# Patient Record
Sex: Female | Born: 1970 | Race: Black or African American | Hispanic: No | Smoking: Current every day smoker
Health system: Southern US, Community
[De-identification: ages and names within clinical notes are randomized; demographics above are authoritative.]

## PROBLEM LIST (undated history)

## (undated) DIAGNOSIS — R569 Unspecified convulsions: Secondary | ICD-10-CM

---

## 2014-11-30 ENCOUNTER — Emergency Department (HOSPITAL_COMMUNITY)
Admission: EM | Admit: 2014-11-30 | Discharge: 2014-11-30 | Disposition: A | Discharge status: Home / Self Care | Payer: Medicaid - Out of State | Attending: Emergency Medicine | Admitting: Emergency Medicine

## 2014-11-30 ENCOUNTER — Encounter (HOSPITAL_COMMUNITY): Payer: Self-pay

## 2014-11-30 ENCOUNTER — Emergency Department (HOSPITAL_COMMUNITY): Payer: Medicaid - Out of State

## 2014-11-30 DIAGNOSIS — B349 Viral infection, unspecified: Secondary | ICD-10-CM | POA: Insufficient documentation

## 2014-11-30 DIAGNOSIS — M791 Myalgia: Secondary | ICD-10-CM | POA: Insufficient documentation

## 2014-11-30 DIAGNOSIS — R05 Cough: Secondary | ICD-10-CM

## 2014-11-30 DIAGNOSIS — N390 Urinary tract infection, site not specified: Secondary | ICD-10-CM | POA: Insufficient documentation

## 2014-11-30 DIAGNOSIS — Z720 Tobacco use: Secondary | ICD-10-CM | POA: Insufficient documentation

## 2014-11-30 DIAGNOSIS — R059 Cough, unspecified: Secondary | ICD-10-CM

## 2014-11-30 DIAGNOSIS — Z3202 Encounter for pregnancy test, result negative: Secondary | ICD-10-CM | POA: Insufficient documentation

## 2014-11-30 HISTORY — DX: Unspecified convulsions: R56.9

## 2014-11-30 LAB — CBC WITH DIFFERENTIAL/PLATELET
BASOS ABS: 0 10*3/uL (ref 0.0–0.1)
Basophils Relative: 0 % (ref 0–1)
Eosinophils Absolute: 0 10*3/uL (ref 0.0–0.7)
Eosinophils Relative: 0 % (ref 0–5)
HEMATOCRIT: 44.4 % (ref 36.0–46.0)
HEMOGLOBIN: 14.6 g/dL (ref 12.0–15.0)
Lymphocytes Relative: 5 % — ABNORMAL LOW (ref 12–46)
Lymphs Abs: 0.5 10*3/uL — ABNORMAL LOW (ref 0.7–4.0)
MCH: 32.7 pg (ref 26.0–34.0)
MCHC: 32.9 g/dL (ref 30.0–36.0)
MCV: 99.3 fL (ref 78.0–100.0)
Monocytes Absolute: 0.5 10*3/uL (ref 0.1–1.0)
Monocytes Relative: 5 % (ref 3–12)
NEUTROS ABS: 8.2 10*3/uL — AB (ref 1.7–7.7)
NEUTROS PCT: 90 % — AB (ref 43–77)
PLATELETS: 350 10*3/uL (ref 150–400)
RBC: 4.47 MIL/uL (ref 3.87–5.11)
RDW: 12.9 % (ref 11.5–15.5)
WBC: 9.2 10*3/uL (ref 4.0–10.5)

## 2014-11-30 LAB — COMPREHENSIVE METABOLIC PANEL
ALBUMIN: 4.8 g/dL (ref 3.5–5.2)
ALK PHOS: 55 U/L (ref 39–117)
ALT: 14 U/L (ref 0–35)
ANION GAP: 10 (ref 5–15)
AST: 19 U/L (ref 0–37)
BUN: 9 mg/dL (ref 6–23)
CHLORIDE: 105 mmol/L (ref 96–112)
CO2: 26 mmol/L (ref 19–32)
Calcium: 9.6 mg/dL (ref 8.4–10.5)
Creatinine, Ser: 0.93 mg/dL (ref 0.50–1.10)
GFR calc Af Amer: 87 mL/min — ABNORMAL LOW (ref >90)
GFR calc non Af Amer: 75 mL/min — ABNORMAL LOW (ref >90)
GLUCOSE: 106 mg/dL — AB (ref 70–99)
Potassium: 3.9 mmol/L (ref 3.5–5.1)
Sodium: 141 mmol/L (ref 135–145)
Total Bilirubin: 0.6 mg/dL (ref 0.3–1.2)
Total Protein: 8.9 g/dL — ABNORMAL HIGH (ref 6.0–8.3)

## 2014-11-30 LAB — URINALYSIS, ROUTINE W REFLEX MICROSCOPIC
Bilirubin Urine: NEGATIVE
GLUCOSE, UA: NEGATIVE mg/dL
KETONES UR: NEGATIVE mg/dL
NITRITE: NEGATIVE
PROTEIN: 30 mg/dL — AB
Specific Gravity, Urine: 1.029 (ref 1.005–1.030)
Urobilinogen, UA: 0.2 mg/dL (ref 0.0–1.0)
pH: 5 (ref 5.0–8.0)

## 2014-11-30 LAB — URINE MICROSCOPIC-ADD ON

## 2014-11-30 LAB — POC URINE PREG, ED: Preg Test, Ur: NEGATIVE

## 2014-11-30 MED ORDER — DEXTROSE 5 % IV SOLN
2.0000 g | Freq: Once | INTRAVENOUS | Status: AC
  Administered 2014-11-30: 2 g via INTRAVENOUS
  Filled 2014-11-30: qty 2

## 2014-11-30 MED ORDER — SULFAMETHOXAZOLE-TRIMETHOPRIM 800-160 MG PO TABS: 1.0000 | ORAL_TABLET | Freq: Two times a day (BID) | ORAL | Status: DC

## 2014-11-30 MED ORDER — HYDROCODONE-ACETAMINOPHEN 5-325 MG PO TABS: 2.0000 | ORAL_TABLET | ORAL | Status: DC | PRN

## 2014-11-30 MED ORDER — SODIUM CHLORIDE 0.9 % IV BOLUS (SEPSIS)
1000.0000 mL | Freq: Once | INTRAVENOUS | Status: AC
  Administered 2014-11-30: 1000 mL via INTRAVENOUS

## 2014-11-30 MED ORDER — BENZONATATE 100 MG PO CAPS
200.0000 mg | ORAL_CAPSULE | Freq: Once | ORAL | Status: AC
  Administered 2014-11-30: 200 mg via ORAL
  Filled 2014-11-30: qty 2

## 2014-11-30 MED ORDER — BENZONATATE 100 MG PO CAPS: 100.0000 mg | ORAL_CAPSULE | Freq: Three times a day (TID) | ORAL | Status: DC

## 2014-11-30 MED ORDER — ONDANSETRON 4 MG PO TBDP: 4.0000 mg | ORAL_TABLET | Freq: Three times a day (TID) | ORAL | Status: DC | PRN

## 2014-11-30 MED ORDER — ACETAMINOPHEN 325 MG PO TABS
650.0000 mg | ORAL_TABLET | Freq: Once | ORAL | Status: AC
  Administered 2014-11-30: 650 mg via ORAL
  Filled 2014-11-30: qty 2

## 2014-11-30 MED ORDER — ALBUTEROL SULFATE (2.5 MG/3ML) 0.083% IN NEBU
2.5000 mg | INHALATION_SOLUTION | RESPIRATORY_TRACT | Status: DC | PRN
  Administered 2014-11-30: 2.5 mg via RESPIRATORY_TRACT
  Filled 2014-11-30: qty 3

## 2014-11-30 MED ORDER — KETOROLAC TROMETHAMINE 30 MG/ML IJ SOLN
30.0000 mg | Freq: Once | INTRAMUSCULAR | Status: AC
  Administered 2014-11-30: 30 mg via INTRAVENOUS
  Filled 2014-11-30: qty 1

## 2014-11-30 NOTE — ED Notes (Signed)
Pt stated that she is new to the area, originally from [place]. Pt just started a job and is unable to afford medications at this time. Pt does not receive funds until next week. Will f/u CM

## 2014-11-30 NOTE — ED Notes (Signed)
Patient states she began having watery stools 2 days ago, but loose stools now. Yesterday she began having a productive cough with thick clear sputum, body aches, chills, nasal congestion, and emesis x 2.

## 2014-11-30 NOTE — ED Provider Notes (Signed)
CSN: [id number]     Arrival date & time 11/30/14  1319 History   First MD Initiated Contact with Patient 11/30/14 1422     Chief Complaint  Patient presents with  . Cough  . Emesis      HPI  Patient presents for evaluation. Has bodyaches, cough, nausea, diarrhea. Urinary frequency but not frank dysuria or hematuria. No migraine or stiffness. No blood pus or mucus in her stools. Heme-negative nonbilious emesis. Symptoms for 48 hours.  Was not immunized for influenza.  Past Medical History  Diagnosis Date  . Seizures    History reviewed. No pertinent past surgical history. Family History  Problem Relation Age of Onset  . Diabetes Mother   . Hypertension Mother   . Aortic aneurysm Father   . Stroke Father   . Hypertension Father    History  Substance Use Topics  . Smoking status: Current Every Day Smoker -- 0.50 packs/day    Types: Cigarettes  . Smokeless tobacco: Never Used  . Alcohol Use: No   OB History    No data available     Review of Systems  Constitutional: Positive for fever and fatigue. Negative for chills, diaphoresis and appetite change.  HENT: Negative for mouth sores, sore throat and trouble swallowing.   Eyes: Negative for visual disturbance.  Respiratory: Positive for cough. Negative for chest tightness, shortness of breath and wheezing.   Cardiovascular: Negative for chest pain.  Gastrointestinal: Negative for nausea, vomiting, abdominal pain, diarrhea and abdominal distention.  Endocrine: Negative for polydipsia, polyphagia and polyuria.  Genitourinary: Positive for urgency and frequency. Negative for dysuria and hematuria.  Musculoskeletal: Positive for myalgias. Negative for gait problem.  Skin: Negative for color change, pallor and rash.  Neurological: Negative for dizziness, syncope, light-headedness and headaches.  Hematological: Does not bruise/bleed easily.  Psychiatric/Behavioral: Negative for behavioral problems and confusion.       Allergies  Review of patient's allergies indicates no known allergies.  Home Medications   Prior to Admission medications   Medication Sig Start Date End Date Taking? Authorizing Provider  benzonatate (TESSALON) 100 MG capsule Take 1 capsule (100 mg total) by mouth every 8 (eight) hours. 11/30/14   Rolland PorterMark Nene Aranas, MD  HYDROcodone-acetaminophen (NORCO/VICODIN) 5-325 MG per tablet Take 2 tablets by mouth every 4 (four) hours as needed. 11/30/14   Rolland PorterMark Alona Danford, MD  ondansetron (ZOFRAN ODT) 4 MG disintegrating tablet Take 1 tablet (4 mg total) by mouth every 8 (eight) hours as needed for nausea. 11/30/14   Rolland PorterMark Leveta Wahab, MD  sulfamethoxazole-trimethoprim (SEPTRA DS) 800-160 MG per tablet Take 1 tablet by mouth every 12 (twelve) hours. 11/30/14   Rolland PorterMark Piotr Christopher, MD   BP 141/97 mmHg  Pulse 106  Temp(Src) 98.1 F (36.7 C) (Oral)  Resp 22  SpO2 100%  LMP 11/22/2014 Physical Exam  Constitutional: She is oriented to person, place, and time. She appears well-developed and well-nourished. No distress.  HENT:  Head: Normocephalic.  Eyes: Conjunctivae are normal. Pupils are equal, round, and reactive to light. No scleral icterus.  Neck: Normal range of motion. Neck supple. No thyromegaly present.  Cardiovascular: Normal rate and regular rhythm.  Exam reveals no gallop and no friction rub.   No murmur heard. Pulmonary/Chest: Effort normal and breath sounds normal. No respiratory distress. She has no wheezes. She has no rales.  Mild wheezing. No focal diminished breath sounds.  Abdominal: Soft. Bowel sounds are normal. She exhibits no distension. There is no tenderness. There is no rebound.  Musculoskeletal:  Normal range of motion.  Neurological: She is alert and oriented to person, place, and time.  Skin: Skin is warm and dry. No rash noted.  Psychiatric: She has a normal mood and affect. Her behavior is normal.    ED Course  Procedures (including critical care time) Labs Review Labs Reviewed  CBC  WITH DIFFERENTIAL/PLATELET - Abnormal; Notable for the following:    Neutrophils Relative % 90 (*)    Neutro Abs 8.2 (*)    Lymphocytes Relative 5 (*)    Lymphs Abs 0.5 (*)    All other components within normal limits  COMPREHENSIVE METABOLIC PANEL - Abnormal; Notable for the following:    Glucose, Bld 106 (*)    Total Protein 8.9 (*)    GFR calc non Af Amer 75 (*)    GFR calc Af Amer 87 (*)    All other components within normal limits  URINALYSIS, ROUTINE W REFLEX MICROSCOPIC - Abnormal; Notable for the following:    APPearance TURBID (*)    Hgb urine dipstick LARGE (*)    Protein, ur 30 (*)    Leukocytes, UA SMALL (*)    All other components within normal limits  URINE MICROSCOPIC-ADD ON - Abnormal; Notable for the following:    Squamous Epithelial / LPF MANY (*)    Bacteria, UA MANY (*)    All other components within normal limits  POC URINE PREG, ED    Imaging Review Dg Chest 2 View  11/30/2014   CLINICAL DATA:  Right-sided chest pain for 1 day. Cough and shortness of breath.  EXAM: CHEST  2 VIEW  COMPARISON:  None.  FINDINGS: The heart size and mediastinal contours are within normal limits. Both lungs are clear. The visualized skeletal structures are unremarkable.  IMPRESSION: No active cardiopulmonary disease.   Electronically Signed   By: Gaylyn Rong M.D.   On: 11/30/2014 15:50     EKG Interpretation None      MDM   Final diagnoses:  Cough  UTI (lower urinary tract infection)  Viral syndrome    Patient's urine does show signs of infection. Her symptoms sound like viral syndrome. No leukocytosis. No chest x-ray abnormalities. Urine culture pending. Plan is IV fluids, IV Rocephin, discharged home, rest, fluids, Tylenol, Bactrim, primary care follow-up. ER with acute changes.    Rolland Porter, MD 11/30/14 [phone]

## 2014-11-30 NOTE — Discharge Instructions (Signed)
Urinary Tract Infection A urinary tract infection (UTI) can occur any place along the urinary tract. The tract includes the kidneys, ureters, bladder, and urethra. A type of germ called bacteria often causes a UTI. UTIs are often helped with antibiotic medicine.  HOME CARE   If given, take antibiotics as told by your doctor. Finish them even if you start to feel better.  Drink enough fluids to keep your pee (urine) clear or pale yellow.  Avoid tea, drinks with caffeine, and bubbly (carbonated) drinks.  Pee often. Avoid holding your pee in for a long time.  Pee before and after having sex (intercourse).  Wipe from front to back after you poop (bowel movement) if you are a woman. Use each tissue only once. GET HELP RIGHT AWAY IF:   You have back pain.  You have lower belly (abdominal) pain.  You have chills.  You feel sick to your stomach (nauseous).  You throw up (vomit).  Your burning or discomfort with peeing does not go away.  You have a fever.  Your symptoms are not better in 3 days. MAKE SURE YOU:   Understand these instructions.  Will watch your condition.  Will get help right away if you are not doing well or get worse. Document Released: 02/12/2008 Document Revised: 05/20/2012 Document Reviewed: 03/26/2012 [facility] Patient Information 2015 [place] [place]. This information is not intended to replace advice given to you by your health care provider. Make sure you discuss any questions you have with your health care provider.  Viral Infections A viral infection can be caused by different types of viruses.Most viral infections are not serious and resolve on their own. However, some infections may cause severe symptoms and may lead to further complications. SYMPTOMS Viruses can frequently cause:  Minor sore throat.  Aches and pains.  Headaches.  Runny nose.  Different types of rashes.  Watery eyes.  Tiredness.  Cough.  Loss of  appetite.  Gastrointestinal infections, resulting in nausea, vomiting, and diarrhea. These symptoms do not respond to antibiotics because the infection is not caused by bacteria. However, you might catch a bacterial infection following the viral infection. This is sometimes called a "superinfection." Symptoms of such a bacterial infection may include:  Worsening sore throat with pus and difficulty swallowing.  Swollen neck glands.  Chills and a high or persistent fever.  Severe headache.  Tenderness over the sinuses.  Persistent overall ill feeling (malaise), muscle aches, and tiredness (fatigue).  Persistent cough.  Yellow, green, or brown mucus production with coughing. HOME CARE INSTRUCTIONS   Only take over-the-counter or prescription medicines for pain, discomfort, diarrhea, or fever as directed by your caregiver.  Drink enough water and fluids to keep your urine clear or pale yellow. Sports drinks can provide valuable electrolytes, sugars, and hydration.  Get plenty of rest and maintain proper nutrition. Soups and broths with crackers or rice are fine. SEEK IMMEDIATE MEDICAL CARE IF:   You have severe headaches, shortness of breath, chest pain, neck pain, or an unusual rash.  You have uncontrolled vomiting, diarrhea, or you are unable to keep down fluids.  You or your child has an oral temperature above 102 F (38.9 C), not controlled by medicine.  Your baby is older than 3 months with a rectal temperature of 102 F (38.9 C) or higher.  Your baby is 373 months old or younger with a rectal temperature of 100.4 F (38 C) or higher. MAKE SURE YOU:   Understand these instructions.  Will  watch your condition.  Will get help right away if you are not doing well or get worse. Document Released: 06/05/2005 Document Revised: 11/18/2011 Document Reviewed: 12/31/2010 [facility] Patient Information 2015 [place], [place]. This information is not intended to replace advice given  to you by your health care provider. Make sure you discuss any questions you have with your health care provider.

## 2014-11-30 NOTE — Progress Notes (Addendum)
   CARE MANAGEMENT ED NOTE 11/30/2014   Date Initiated:  11/30/2014  Documentation initiated by:  Edd ArbourGIBBS,[name]  Subjective/Objective Assessment:   44 yr old self pay [place] pt States she recently moved to [place] from [place]/owatery stools 2 days ago, but loose stools now. Yesterday she began having a productive cough with thick clear sputum, body aches, chills, nasal congestion, and emesis x 2..                  Subjective/Objective Assessment Detail:   CM noted pt peeking out of her ed room door with mask intact Pt inquired about bathroom to get her urine specimen Pt directed to bathroom  Pt discussed with CM not getting sick for years until moved to          Action/Plan:   Noted [place] pcp spoke with pt and gave resources for self pay pts   Action/Plan Detail:   Anticipated DC Date:  11/30/2014     Status Recommendation to Physician:   Result of Recommendation:    Other ED Services  Consult Working Plan    DC Planning Services  Other  Outpatient Services - Pt will follow up  PCP issues    Choice offered to / List presented to:            Status of service:  Completed, signed off  ED Comments:   ED Comments Detail:    1700 spoke with pt about cost of d/c rx Discussed family, friends and church members for co pay assistance.  Pt states she was calling her female friend to get help with co pays for medicine.  Discussed with her that [facility] facilities does not have a program to assist with medications.  Pt to be offered good rx coupons, discounts by[facility] ED PM CM.  CM spoke with pt who [place]cp. CM discussed and provided written information for self pay pcps, importance of pcp for f/u care, www.needymeds.org, www.goodrx.com, discounted pharmacies and other [place] resources such as [facility], [facility], affordable care act,  financial assistance, DSS and  health department   Reviewed resources for [place] self pay pcps like [facility], [facility] medicine at [address], [facility], general medical clinics, [facility] urgent care plus others, medication resources, [facility] out [facility] pharmacies and housing Pt voiced understanding and appreciation of resources provided  Provided [facility] contact information Pt agreed to [facility] referral.  Pt denied having an orange card Cm completed referral

## 2016-02-21 ENCOUNTER — Emergency Department (HOSPITAL_COMMUNITY)
Admission: EM | Admit: 2016-02-21 | Discharge: 2016-02-21 | Disposition: A | Discharge status: Home / Self Care | Payer: Medicaid - Out of State | Attending: Emergency Medicine | Admitting: Emergency Medicine

## 2016-02-21 ENCOUNTER — Encounter (HOSPITAL_COMMUNITY): Payer: Self-pay | Admitting: Emergency Medicine

## 2016-02-21 DIAGNOSIS — G40909 Epilepsy, unspecified, not intractable, without status epilepticus: Secondary | ICD-10-CM | POA: Insufficient documentation

## 2016-02-21 DIAGNOSIS — R1110 Vomiting, unspecified: Secondary | ICD-10-CM | POA: Insufficient documentation

## 2016-02-21 DIAGNOSIS — F17210 Nicotine dependence, cigarettes, uncomplicated: Secondary | ICD-10-CM | POA: Insufficient documentation

## 2016-02-21 DIAGNOSIS — R197 Diarrhea, unspecified: Secondary | ICD-10-CM

## 2016-02-21 MED ORDER — LOPERAMIDE HCL 2 MG PO CAPS: 2.0000 mg | ORAL_CAPSULE | Freq: Four times a day (QID) | ORAL | Status: DC | PRN

## 2016-02-21 NOTE — ED Notes (Signed)
Pt is in stable condition upon d/c and ambulates from ED. 

## 2016-02-21 NOTE — ED Provider Notes (Signed)
CSN: [id number]     Arrival date & time 02/21/16  1545 History  By signing my name below, I, [name], attest that this documentation has been prepared under the direction and in the presence of Gaylyn RongSamantha Anthonie Lotito, PA-C Electronically Signed: [name], ED Scribe. 02/21/2016. 6:33 PM.   Chief Complaint  Patient presents with  . wants Dr's note       The history is provided by the patient. No language interpreter was used.    HPI Comments: Nicole BirksOthelia Blevins is a 45 y.o. female who presents to the Emergency Department requesting a doctors note onset today. Pt reports that she missed work 2 days ago due to vomiting and diarrhea. Pt notes that her symptoms began 3 days ago with mild HA, v/d, and diaphoresis that resolved mildly 2 days ago. Pt reports that she was not seen for her symptoms and she returned to work yesterday and was informed that she will need a doctors note to return to work. Pt denies any symptoms at this time. She states that she is having associated symptoms of mild diarrhea and mild abdominal pain. Pt notes that her abdominal pain is alleviated following a bowel movement. She states that she has tried pepto-bismol with no relief for her symptoms. She denies fever, chills, vomiting, blood in stools, and any other symptoms.   Past Medical History  Diagnosis Date  . Seizures (HCC)    History reviewed. No pertinent past surgical history. Family History  Problem Relation Age of Onset  . Diabetes Mother   . Hypertension Mother   . Aortic aneurysm Father   . Stroke Father   . Hypertension Father    Social History  Substance Use Topics  . Smoking status: Current Every Day Smoker -- 0.50 packs/day    Types: Cigarettes  . Smokeless tobacco: Never Used  . Alcohol Use: Yes   OB History    No data available     Review of Systems  Constitutional: Negative for fever and chills.  Gastrointestinal: Positive for abdominal pain and diarrhea (mild). Negative for vomiting and  blood in stool.  All other systems reviewed and are negative.     Allergies  Review of patient's allergies indicates no known allergies.  Home Medications   Prior to Admission medications   Medication Sig Start Date End Date Taking? Authorizing Provider  benzonatate (TESSALON) 100 MG capsule Take 1 capsule (100 mg total) by mouth every 8 (eight) hours. 11/30/14   Rolland PorterMark James, MD  HYDROcodone-acetaminophen (NORCO/VICODIN) 5-325 MG per tablet Take 2 tablets by mouth every 4 (four) hours as needed. 11/30/14   Rolland PorterMark James, MD  ondansetron (ZOFRAN ODT) 4 MG disintegrating tablet Take 1 tablet (4 mg total) by mouth every 8 (eight) hours as needed for nausea. 11/30/14   Rolland PorterMark James, MD  sulfamethoxazole-trimethoprim (SEPTRA DS) 800-160 MG per tablet Take 1 tablet by mouth every 12 (twelve) hours. 11/30/14   Rolland PorterMark James, MD   BP 169/109 mmHg  Pulse 66  Temp(Src) 98.8 F (37.1 C) (Oral)  Resp 18  SpO2 98% Physical Exam  Constitutional: She is oriented to person, place, and time. She appears well-developed and well-nourished. No distress.  HENT:  Head: Normocephalic and atraumatic.  Eyes: Conjunctivae are normal. Right eye exhibits no discharge. Left eye exhibits no discharge. No scleral icterus.  Cardiovascular: Normal rate.   Pulmonary/Chest: Effort normal.  Abdominal: Soft. She exhibits no mass. There is no tenderness. There is no rebound and no guarding.  Neurological: She is  alert and oriented to person, place, and time. Coordination normal.  Skin: Skin is warm and dry. No rash noted. She is not diaphoretic. No erythema. No pallor.  Psychiatric: She has a normal mood and affect. Her behavior is normal.  Nursing note and vitals reviewed.   ED Course  Procedures (including critical care time) DIAGNOSTIC STUDIES: Oxygen Saturation is 98% on RA, nl by my interpretation.    COORDINATION OF CARE: 6:30 PM Discussed treatment plan with pt at bedside and pt agreed to plan.    Labs  Review Labs Reviewed - No data to display  Imaging Review No results found.   EKG Interpretation None      MDM   Final diagnoses:  Diarrhea, unspecified type    Otherwise healthy female presents the ED today requesting a work note stating she can go back to work after being sick for the last couple of days. She states she had a couple days of vomiting and diarrhea that her symptoms have since improved. She appears well in the ED. No fevers or chills. No abdominal tenderness. Feel the patient is safe to return to work. Follow-up with PCP as needed. We'll give prescription for Imodium for intermittent diarrhea.  I personally performed the services described in this documentation, which was scribed in my presence. The recorded information has been reviewed and is accurate.     Lester Kinsman [place], PA-C 02/21/16 2051  Benjiman Core, MD 02/22/16 [phone]

## 2016-02-21 NOTE — Discharge Instructions (Signed)
Diarrhea Diarrhea is frequent loose and watery bowel movements. It can cause you to feel weak and dehydrated. Dehydration can cause you to become tired and thirsty, have a dry mouth, and have decreased urination that often is dark yellow. Diarrhea is a sign of another problem, most often an infection that will not last long. In most cases, diarrhea typically lasts 2-3 days. However, it can last longer if it is a sign of something more serious. It is important to treat your diarrhea as directed by your caregiver to lessen or prevent future episodes of diarrhea. CAUSES  Some common causes include:  Gastrointestinal infections caused by viruses, bacteria, or parasites.  Food poisoning or food allergies.  Certain medicines, such as antibiotics, chemotherapy, and laxatives.  Artificial sweeteners and fructose.  Digestive disorders. HOME CARE INSTRUCTIONS  Ensure adequate fluid intake (hydration): Have 1 cup (8 oz) of fluid for each diarrhea episode. Avoid fluids that contain simple sugars or sports drinks, fruit juices, whole milk products, and sodas. Your urine should be clear or pale yellow if you are drinking enough fluids. Hydrate with an oral rehydration solution that you can purchase at pharmacies, retail stores, and online. You can prepare an oral rehydration solution at home by mixing the following ingredients together:   - tsp table salt.   tsp baking soda.   tsp salt substitute containing potassium chloride.  1  tablespoons sugar.  1 L (34 oz) of water.  Certain foods and beverages may increase the speed at which food moves through the gastrointestinal (GI) tract. These foods and beverages should be avoided and include:  Caffeinated and alcoholic beverages.  High-fiber foods, such as raw fruits and vegetables, nuts, seeds, and whole grain breads and cereals.  Foods and beverages sweetened with sugar alcohols, such as xylitol, sorbitol, and mannitol.  Some foods may be well  tolerated and may help thicken stool including:  Starchy foods, such as rice, toast, pasta, low-sugar cereal, oatmeal, grits, baked potatoes, crackers, and bagels.  Bananas.  Applesauce.  Add probiotic-rich foods to help increase healthy bacteria in the GI tract, such as yogurt and fermented milk products.  Wash your hands well after each diarrhea episode.  Only take over-the-counter or prescription medicines as directed by your caregiver.  Take a warm bath to relieve any burning or pain from frequent diarrhea episodes. SEEK IMMEDIATE MEDICAL CARE IF:   You are unable to keep fluids down.  You have persistent vomiting.  You have blood in your stool, or your stools are black and tarry.  You do not urinate in 6-8 hours, or there is only a small amount of very dark urine.  You have abdominal pain that increases or localizes.  You have weakness, dizziness, confusion, or light-headedness.  You have a severe headache.  Your diarrhea gets worse or does not get better.  You have a fever or persistent symptoms for more than 2-3 days.  You have a fever and your symptoms suddenly get worse. MAKE SURE YOU:   Understand these instructions.  Will watch your condition.  Will get help right away if you are not doing well or get worse.   This information is not intended to replace advice given to you by your health care provider. Make sure you discuss any questions you have with your health care provider.   Follow up with your OBGYN for further questions about contraception. Take immodium as needed for diarrhea. Return tot he ED if you experience severe worsening of your symptoms,  fevers, chills, severe belly pain, vomiting, blood in your stool.

## 2016-02-21 NOTE — ED Notes (Signed)
Pt states she missed work for vomiting and diarrhea on Monday.  Returned to work on Tuesday and was told she had to have a doctor's note.  Pt states she feels fine now.

## 2016-05-27 ENCOUNTER — Emergency Department (HOSPITAL_COMMUNITY)
Admission: EM | Admit: 2016-05-27 | Discharge: 2016-05-27 | Disposition: A | Discharge status: Home / Self Care | Payer: Self-pay | Attending: Emergency Medicine | Admitting: Emergency Medicine

## 2016-05-27 ENCOUNTER — Encounter (HOSPITAL_COMMUNITY): Payer: Self-pay | Admitting: Vascular Surgery

## 2016-05-27 DIAGNOSIS — R112 Nausea with vomiting, unspecified: Secondary | ICD-10-CM | POA: Insufficient documentation

## 2016-05-27 DIAGNOSIS — F17210 Nicotine dependence, cigarettes, uncomplicated: Secondary | ICD-10-CM | POA: Insufficient documentation

## 2016-05-27 DIAGNOSIS — Z5321 Procedure and treatment not carried out due to patient leaving prior to being seen by health care provider: Secondary | ICD-10-CM | POA: Insufficient documentation

## 2016-05-27 LAB — CBC
HEMATOCRIT: 38.2 % (ref 36.0–46.0)
Hemoglobin: 12.7 g/dL (ref 12.0–15.0)
MCH: 31.9 pg (ref 26.0–34.0)
MCHC: 33.2 g/dL (ref 30.0–36.0)
MCV: 96 fL (ref 78.0–100.0)
PLATELETS: 340 10*3/uL (ref 150–400)
RBC: 3.98 MIL/uL (ref 3.87–5.11)
RDW: 13.3 % (ref 11.5–15.5)
WBC: 6.3 10*3/uL (ref 4.0–10.5)

## 2016-05-27 LAB — COMPREHENSIVE METABOLIC PANEL
ALT: 11 U/L — ABNORMAL LOW (ref 14–54)
AST: 14 U/L — AB (ref 15–41)
Albumin: 3.8 g/dL (ref 3.5–5.0)
Alkaline Phosphatase: 44 U/L (ref 38–126)
Anion gap: 6 (ref 5–15)
BILIRUBIN TOTAL: 0.2 mg/dL — AB (ref 0.3–1.2)
BUN: 8 mg/dL (ref 6–20)
CO2: 23 mmol/L (ref 22–32)
CREATININE: 0.93 mg/dL (ref 0.44–1.00)
Calcium: 8.9 mg/dL (ref 8.9–10.3)
Chloride: 108 mmol/L (ref 101–111)
Glucose, Bld: 97 mg/dL (ref 65–99)
POTASSIUM: 3.5 mmol/L (ref 3.5–5.1)
Sodium: 137 mmol/L (ref 135–145)
TOTAL PROTEIN: 6.5 g/dL (ref 6.5–8.1)

## 2016-05-27 LAB — LIPASE, BLOOD: Lipase: 20 U/L (ref 11–51)

## 2016-05-27 NOTE — ED Triage Notes (Signed)
Pt reports to the ED for eval of multiple complaints. She reports she has had a HA since yesterday, some N/V since yesterday, dry mouth intermittently x 2-3 weeks, night sweats, and hot flashes. States she has had all of these symptoms for a while. Was seen here for similar symptoms several months ago and she reports she was not given a specific dx.

## 2016-05-27 NOTE — ED Notes (Signed)
Per pt request, she was given a note stating that she was here yesterday and left before being seen by MD.

## 2016-05-27 NOTE — ED Notes (Signed)
Pt upset and refuse to give urine. Pt states she wants to see the dr then leave.

## 2019-03-12 ENCOUNTER — Other Ambulatory Visit: Payer: Self-pay

## 2019-03-12 ENCOUNTER — Emergency Department (HOSPITAL_COMMUNITY): Payer: BLUE CROSS/BLUE SHIELD

## 2019-03-12 ENCOUNTER — Emergency Department (HOSPITAL_COMMUNITY)
Admission: EM | Admit: 2019-03-12 | Discharge: 2019-03-12 | Disposition: A | Discharge status: Home / Self Care | Payer: BLUE CROSS/BLUE SHIELD | Attending: Emergency Medicine | Admitting: Emergency Medicine

## 2019-03-12 DIAGNOSIS — Y939 Activity, unspecified: Secondary | ICD-10-CM | POA: Insufficient documentation

## 2019-03-12 DIAGNOSIS — Y999 Unspecified external cause status: Secondary | ICD-10-CM | POA: Diagnosis not present

## 2019-03-12 DIAGNOSIS — S90812A Abrasion, left foot, initial encounter: Secondary | ICD-10-CM | POA: Diagnosis not present

## 2019-03-12 DIAGNOSIS — S90811A Abrasion, right foot, initial encounter: Secondary | ICD-10-CM | POA: Diagnosis not present

## 2019-03-12 DIAGNOSIS — Y929 Unspecified place or not applicable: Secondary | ICD-10-CM | POA: Diagnosis not present

## 2019-03-12 DIAGNOSIS — T148XXA Other injury of unspecified body region, initial encounter: Secondary | ICD-10-CM

## 2019-03-12 DIAGNOSIS — F1290 Cannabis use, unspecified, uncomplicated: Secondary | ICD-10-CM | POA: Diagnosis not present

## 2019-03-12 DIAGNOSIS — T07XXXA Unspecified multiple injuries, initial encounter: Secondary | ICD-10-CM | POA: Diagnosis present

## 2019-03-12 DIAGNOSIS — Z23 Encounter for immunization: Secondary | ICD-10-CM | POA: Diagnosis not present

## 2019-03-12 DIAGNOSIS — F17210 Nicotine dependence, cigarettes, uncomplicated: Secondary | ICD-10-CM | POA: Insufficient documentation

## 2019-03-12 MED ORDER — TETANUS-DIPHTH-ACELL PERTUSSIS 5-2.5-18.5 LF-MCG/0.5 IM SUSP
0.5000 mL | Freq: Once | INTRAMUSCULAR | Status: AC
  Administered 2019-03-12: 0.5 mL via INTRAMUSCULAR
  Filled 2019-03-12: qty 0.5

## 2019-03-12 MED ORDER — BACITRACIN-NEOMYCIN-POLYMYXIN OINTMENT TUBE
TOPICAL_OINTMENT | Freq: Once | CUTANEOUS | Status: AC
  Administered 2019-03-12: 03:00:00 via TOPICAL
  Filled 2019-03-12: qty 14
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
## 2019-06-20 ENCOUNTER — Other Ambulatory Visit: Payer: Self-pay

## 2019-06-20 ENCOUNTER — Encounter (HOSPITAL_COMMUNITY): Payer: Self-pay | Admitting: Emergency Medicine

## 2019-06-20 ENCOUNTER — Emergency Department (HOSPITAL_COMMUNITY)
Admission: EM | Admit: 2019-06-20 | Discharge: 2019-06-20 | Disposition: A | Discharge status: Home / Self Care | Payer: BLUE CROSS/BLUE SHIELD | Attending: Emergency Medicine | Admitting: Emergency Medicine

## 2019-06-20 ENCOUNTER — Emergency Department (HOSPITAL_COMMUNITY): Payer: BLUE CROSS/BLUE SHIELD

## 2019-06-20 DIAGNOSIS — F1210 Cannabis abuse, uncomplicated: Secondary | ICD-10-CM | POA: Insufficient documentation

## 2019-06-20 DIAGNOSIS — R1013 Epigastric pain: Secondary | ICD-10-CM | POA: Insufficient documentation

## 2019-06-20 DIAGNOSIS — R61 Generalized hyperhidrosis: Secondary | ICD-10-CM | POA: Diagnosis not present

## 2019-06-20 DIAGNOSIS — F17210 Nicotine dependence, cigarettes, uncomplicated: Secondary | ICD-10-CM | POA: Diagnosis not present

## 2019-06-20 DIAGNOSIS — R197 Diarrhea, unspecified: Secondary | ICD-10-CM | POA: Insufficient documentation

## 2019-06-20 DIAGNOSIS — R112 Nausea with vomiting, unspecified: Secondary | ICD-10-CM | POA: Diagnosis not present

## 2019-06-20 LAB — COMPREHENSIVE METABOLIC PANEL
ALT: 14 U/L (ref 0–44)
AST: 16 U/L (ref 15–41)
Albumin: 3.9 g/dL (ref 3.5–5.0)
Alkaline Phosphatase: 51 U/L (ref 38–126)
Anion gap: 11 (ref 5–15)
BUN: 6 mg/dL (ref 6–20)
CO2: 22 mmol/L (ref 22–32)
Calcium: 9.5 mg/dL (ref 8.9–10.3)
Chloride: 105 mmol/L (ref 98–111)
Creatinine, Ser: 0.94 mg/dL (ref 0.44–1.00)
GFR calc Af Amer: >60 mL/min (ref >60)
GFR calc non Af Amer: >60 mL/min (ref >60)
Glucose, Bld: 137 mg/dL — ABNORMAL HIGH (ref 70–99)
Potassium: 3.8 mmol/L (ref 3.5–5.1)
Sodium: 138 mmol/L (ref 135–145)
Total Bilirubin: 0.8 mg/dL (ref 0.3–1.2)
Total Protein: 7.5 g/dL (ref 6.5–8.1)

## 2019-06-20 LAB — CBC WITH DIFFERENTIAL/PLATELET
Abs Immature Granulocytes: 0.04 10*3/uL (ref 0.00–0.07)
Basophils Absolute: 0 10*3/uL (ref 0.0–0.1)
Basophils Relative: 0 %
Eosinophils Absolute: 0 10*3/uL (ref 0.0–0.5)
Eosinophils Relative: 0 %
HCT: 42.8 % (ref 36.0–46.0)
Hemoglobin: 14.4 g/dL (ref 12.0–15.0)
Immature Granulocytes: 0 %
Lymphocytes Relative: 7 %
Lymphs Abs: 0.8 10*3/uL (ref 0.7–4.0)
MCH: 33.2 pg (ref 26.0–34.0)
MCHC: 33.6 g/dL (ref 30.0–36.0)
MCV: 98.6 fL (ref 80.0–100.0)
Monocytes Absolute: 0.3 10*3/uL (ref 0.1–1.0)
Monocytes Relative: 3 %
Neutro Abs: 10.9 10*3/uL — ABNORMAL HIGH (ref 1.7–7.7)
Neutrophils Relative %: 90 %
Platelets: 361 10*3/uL (ref 150–400)
RBC: 4.34 MIL/uL (ref 3.87–5.11)
RDW: 13.1 % (ref 11.5–15.5)
WBC: 12.2 10*3/uL — ABNORMAL HIGH (ref 4.0–10.5)
nRBC: 0 % (ref 0.0–0.2)

## 2019-06-20 LAB — LIPASE, BLOOD: Lipase: 19 U/L (ref 11–51)

## 2019-06-20 MED ORDER — ALUM & MAG HYDROXIDE-SIMETH 200-200-20 MG/5ML PO SUSP
30.0000 mL | Freq: Once | ORAL | Status: AC
  Administered 2019-06-20: 09:00:00 30 mL via ORAL
  Filled 2019-06-20: qty 30

## 2019-06-20 MED ORDER — MORPHINE SULFATE (PF) 4 MG/ML IV SOLN
4.0000 mg | Freq: Once | INTRAVENOUS | Status: AC
  Administered 2019-06-20: 4 mg via INTRAVENOUS
  Filled 2019-06-20: qty 1

## 2019-06-20 MED ORDER — ONDANSETRON HCL 4 MG/2ML IJ SOLN
4.0000 mg | Freq: Once | INTRAMUSCULAR | Status: AC
  Administered 2019-06-20: 4 mg via INTRAVENOUS
  Filled 2019-06-20: qty 2

## 2019-06-20 MED ORDER — SODIUM CHLORIDE 0.9 % IV BOLUS
1000.0000 mL | Freq: Once | INTRAVENOUS | Status: AC
  Administered 2019-06-20: 1000 mL via INTRAVENOUS

## 2019-06-20 MED ORDER — ONDANSETRON 4 MG PO TBDP: 4.0000 mg | ORAL_TABLET | Freq: Three times a day (TID) | ORAL | 0 refills | Status: DC | PRN

## 2019-06-20 MED ORDER — DICYCLOMINE HCL 10 MG/5ML PO SOLN
10.0000 mg | Freq: Once | ORAL | Status: AC
  Administered 2019-06-20: 10 mg via ORAL
  Filled 2019-06-20: qty 5

## 2019-06-20 NOTE — ED Triage Notes (Signed)
Per [facility] pt c/o upper abdominal pain and nausea onset of 2am. Reports dry heaving, EMS reports no emesis during transport.

## 2019-06-20 NOTE — Discharge Instructions (Signed)
Zofran for nausea, imodium for diarrhea.   Please return for worsening abdominal pain or if it localizes to one area.  Also return if you are unable to keep anything down or have a persistent fever.  Please follow-up with your family doctor in the office.

## 2019-06-20 NOTE — ED Provider Notes (Signed)
[facility] EMERGENCY DEPARTMENT Provider Note   CSN: [id number] Arrival date & time: 06/20/19  0820     History   Chief Complaint Chief Complaint  Patient presents with  . Abdominal Pain    HPI Nicole Blevins is a 48 y.o. female.     48 yo F with a chief complaints of nausea vomiting and diarrhea.  This started about 6 hours ago.  Patient denies any suspicious food intake denies recent travel.  Having some subjective sweating while she was vomiting.  Started with epigastric abdominal pain and then migrated into the entire abdomen with vomiting and diarrhea.  No known sick contacts.  The history is provided by the patient.  Abdominal Pain Pain location:  Epigastric Pain quality: aching and burning   Pain radiates to:  Does not radiate Pain severity:  Moderate Onset quality:  Gradual Duration:  6 hours Timing:  Constant Progression:  Partially resolved Chronicity:  New Relieved by:  Nothing Worsened by:  Nothing Ineffective treatments:  None tried Associated symptoms: diarrhea, nausea and vomiting   Associated symptoms: no chest pain, no chills, no dysuria, no fever and no shortness of breath     Past Medical History:  Diagnosis Date  . Seizures ([place])     There are no active problems to display for this patient.   History reviewed. No pertinent surgical history.   OB History   No obstetric history on file.      Home Medications    Prior to Admission medications   Medication Sig Start Date End Date Taking? Authorizing Provider  benzonatate (TESSALON) 100 MG capsule Take 1 capsule (100 mg total) by mouth every 8 (eight) hours. 11/30/14   Tanna Furry, MD  HYDROcodone-acetaminophen (NORCO/VICODIN) 5-325 MG per tablet Take 2 tablets by mouth every 4 (four) hours as needed. 11/30/14   Tanna Furry, MD  loperamide (IMODIUM) 2 MG capsule Take 1 capsule (2 mg total) by mouth 4 (four) times daily as needed for diarrhea or loose stools. 02/21/16    Dowless, Samantha Tripp, PA-C  ondansetron (ZOFRAN ODT) 4 MG disintegrating tablet Take 1 tablet (4 mg total) by mouth every 8 (eight) hours as needed for nausea or vomiting. 06/20/19   Deno Etienne, DO  sulfamethoxazole-trimethoprim (SEPTRA DS) 800-160 MG per tablet Take 1 tablet by mouth every 12 (twelve) hours. 11/30/14   Tanna Furry, MD    Family History Family History  Problem Relation Age of Onset  . Diabetes Mother   . Hypertension Mother   . Aortic aneurysm Father   . Stroke Father   . Hypertension Father     Social History Social History   Tobacco Use  . Smoking status: Current Every Day Smoker    Packs/day: 0.50    Types: Cigarettes  . Smokeless tobacco: Never Used  Substance Use Topics  . Alcohol use: Yes  . Drug use: Yes    Types: Marijuana    Comment: every other day     Allergies   Patient has no known allergies.   Review of Systems Review of Systems  Constitutional: Negative for chills and fever.  HENT: Negative for congestion and rhinorrhea.   Eyes: Negative for redness and visual disturbance.  Respiratory: Negative for shortness of breath and wheezing.   Cardiovascular: Negative for chest pain and palpitations.  Gastrointestinal: Positive for abdominal pain, diarrhea, nausea and vomiting.  Genitourinary: Negative for dysuria and urgency.  Musculoskeletal: Negative for arthralgias and myalgias.  Skin: Negative for pallor and  wound.  Neurological: Negative for dizziness and headaches.     Physical Exam Updated Vital Signs BP (!) 187/120   Pulse 77   Temp 98.7 F (37.1 C) (Oral)   Resp 19   Ht 5\' 4"  (1.626 m)   Wt 98.9 kg   SpO2 96%   BMI 37.42 kg/m   Physical Exam Vitals signs and nursing note reviewed.  Constitutional:      General: She is not in acute distress.    Appearance: She is well-developed. She is not diaphoretic.  HENT:     Head: Normocephalic and atraumatic.  Eyes:     Pupils: Pupils are equal, round, and reactive to  light.  Neck:     Musculoskeletal: Normal range of motion and neck supple.  Cardiovascular:     Rate and Rhythm: Normal rate and regular rhythm.     Heart sounds: No murmur. No friction rub. No gallop.   Pulmonary:     Effort: Pulmonary effort is normal.     Breath sounds: No wheezing or rales.  Abdominal:     General: There is no distension.     Palpations: Abdomen is soft.     Tenderness: There is abdominal tenderness (mild diffuse, worse to the epigastrium).  Musculoskeletal:        General: No tenderness.  Skin:    General: Skin is warm and dry.  Neurological:     Mental Status: She is alert and oriented to person, place, and time.  Psychiatric:        Behavior: Behavior normal.      ED Treatments / Results  Labs (all labs ordered are listed, but only abnormal results are displayed) Labs Reviewed  CBC WITH DIFFERENTIAL/PLATELET - Abnormal; Notable for the following components:      Result Value   WBC 12.2 (*)    Neutro Abs 10.9 (*)    All other components within normal limits  COMPREHENSIVE METABOLIC PANEL - Abnormal; Notable for the following components:   Glucose, Bld 137 (*)    All other components within normal limits  LIPASE, BLOOD    EKG EKG Interpretation  Date/Time:  Sunday June 20 2019 08:31:45 EDT Ventricular Rate:  71 PR Interval:    QRS Duration: 89 QT Interval:  408 QTC Calculation: 444 R Axis:   6 Text Interpretation:  Sinus rhythm Probable anteroseptal infarct, recent No old tracing to compare Confirmed by [birth year]1995 [phone]) on 06/20/2019 8:34:43 AM   Radiology Dg Chest Port 1 View  Result Date: 06/20/2019 CLINICAL DATA:  Cough. EXAM: PORTABLE CHEST 1 VIEW COMPARISON:  November 30, 2014. FINDINGS: The heart size and mediastinal contours are within normal limits. Both lungs are clear. The visualized skeletal structures are unremarkable. IMPRESSION: No active disease. Electronically Signed   By: December 02, 2014 M.D.   On: 06/20/2019 09:52     Procedures Procedures (including critical care time) Discussed smoking cessation with patient and was they were offerred resources to help stop.  Total time was 5 min CPT code 08/20/2019.   Medications Ordered in ED Medications  dicyclomine (BENTYL) 10 MG/5ML solution 10 mg (has no administration in time range)  alum & mag hydroxide-simeth (MAALOX/MYLANTA) 200-200-20 MG/5ML suspension 30 mL (30 mLs Oral Given 06/20/19 0859)  morphine 4 MG/ML injection 4 mg (4 mg Intravenous Given 06/20/19 0854)  ondansetron (ZOFRAN) injection 4 mg (4 mg Intravenous Given 06/20/19 0853)  sodium chloride 0.9 % bolus 1,000 mL (1,000 mLs Intravenous New Bag/Given 06/20/19 0847)  Initial Impression / Assessment and Plan / ED Course  I have reviewed the triage vital signs and the nursing notes.  Pertinent labs & imaging results that were available during my care of the patient were reviewed by me and considered in my medical decision making (see chart for details).        48 yo F with a chief complaint of nausea vomiting and diarrhea.  This started last night about 6 hours ago.  She is well-appearing and nontoxic.  Mild diffuse abdominal tenderness.  Will obtain lab work.  Bolus of IV fluids treat pain and nausea.  Oral trial.  Based on history and exam most likely this is a viral illness.  Patient is feeling better on reassessment tolerating p.o. without difficulty.  Will discharge home.  PCP follow-up.  10:07 AM:  I have discussed the diagnosis/risks/treatment options with the patient and believe the pt to be eligible for discharge home to follow-up with PCP. We also discussed returning to the ED immediately if new or worsening sx occur. We discussed the sx which are most concerning (e.g., sudden worsening pain, fever, inability to tolerate by mouth) that necessitate immediate return. Medications administered to the patient during their visit and any new prescriptions provided to the patient are listed below.   Medications given during this visit Medications  dicyclomine (BENTYL) 10 MG/5ML solution 10 mg (has no administration in time range)  alum & mag hydroxide-simeth (MAALOX/MYLANTA) 200-200-20 MG/5ML suspension 30 mL (30 mLs Oral Given 06/20/19 0859)  morphine 4 MG/ML injection 4 mg (4 mg Intravenous Given 06/20/19 0854)  ondansetron (ZOFRAN) injection 4 mg (4 mg Intravenous Given 06/20/19 0853)  sodium chloride 0.9 % bolus 1,000 mL (1,000 mLs Intravenous New Bag/Given 06/20/19 0847)     The patient appears reasonably screen and/or stabilized for discharge and I doubt any other medical condition or other [facility] requiring further screening, evaluation, or treatment in the ED at this time prior to discharge.    Final Clinical Impressions(s) / ED Diagnoses   Final diagnoses:  Nausea vomiting and diarrhea    ED Discharge Orders         Ordered    ondansetron (ZOFRAN ODT) 4 MG disintegrating tablet  Every 8 hours PRN     06/20/19 1006           [place], Haniyyah Sakuma, DO 06/20/19 1007

## 2019-06-22 ENCOUNTER — Encounter (HOSPITAL_COMMUNITY): Payer: Self-pay

## 2019-06-22 ENCOUNTER — Ambulatory Visit (HOSPITAL_COMMUNITY)
Admission: EM | Admit: 2019-06-22 | Discharge: 2019-06-22 | Disposition: A | Discharge status: Home / Self Care | Payer: BLUE CROSS/BLUE SHIELD | Attending: Family Medicine | Admitting: Family Medicine

## 2019-06-22 DIAGNOSIS — A084 Viral intestinal infection, unspecified: Secondary | ICD-10-CM

## 2019-06-22 DIAGNOSIS — Z0289 Encounter for other administrative examinations: Secondary | ICD-10-CM | POA: Diagnosis not present

## 2019-06-22 DIAGNOSIS — Z8619 Personal history of other infectious and parasitic diseases: Secondary | ICD-10-CM

## 2019-06-22 NOTE — ED Triage Notes (Signed)
Pt needs a letter to return to work. Pt states she was seen at the ED 2 days ago for nausea, vomiting and diarrhea and now is resolved.

## 2019-06-22 NOTE — ED Provider Notes (Signed)
[facility]    CSN: [id number] Arrival date & time: 06/22/19  0907      History   Chief Complaint Chief Complaint  Patient presents with  . Letter for School/Work    HPI Nicole Blevins is a 48 y.o. female.   HPI  Patient was seen in the emergency room 2 days ago for viral gastroenteritis.  She feels completely well.  Since yesterday she has had no diarrhea.  She is keeping down solid food.  She is drinking water normally.  Today she feels well and tried to go back to work.  She states that she needs a note from a physician before they will allow her to return.  He works in a housekeeping position.  Past Medical History:  Diagnosis Date  . Seizures ([facility])     There are no active problems to display for this patient.   History reviewed. No pertinent surgical history.  OB History   No obstetric history on file.      Home Medications    Prior to Admission medications   Not on File    Family History Family History  Problem Relation Age of Onset  . Diabetes Mother   . Hypertension Mother   . Aortic aneurysm Father   . Stroke Father   . Hypertension Father     Social History Social History   Tobacco Use  . Smoking status: Current Every Day Smoker    Packs/day: 0.50    Types: Cigarettes  . Smokeless tobacco: Never Used  Substance Use Topics  . Alcohol use: Yes  . Drug use: Yes    Types: Marijuana    Comment: every other day     Allergies   Patient has no known allergies.   Review of Systems Review of Systems  Constitutional: Negative for chills and fever.  HENT: Negative for ear pain and sore throat.   Eyes: Negative for pain and visual disturbance.  Respiratory: Negative for cough and shortness of breath.   Cardiovascular: Negative for chest pain and palpitations.  Gastrointestinal: Negative for abdominal pain and vomiting.  Genitourinary: Negative for dysuria and hematuria.  Musculoskeletal: Negative for arthralgias and back  pain.  Skin: Negative for color change and rash.  Neurological: Negative for seizures and syncope.  All other systems reviewed and are negative.    Physical Exam Triage Vital Signs ED Triage Vitals  Enc Vitals Group     BP 06/22/19 1009 (!) 169/97     Pulse Rate 06/22/19 1009 66     Resp 06/22/19 1009 16     Temp 06/22/19 1009 98.3 F (36.8 C)     Temp Source 06/22/19 1009 Oral     SpO2 06/22/19 1009 95 %     Weight --      Height --      Head Circumference --      Peak Flow --      Pain Score 06/22/19 1008 0     Pain Loc --      Pain Edu? --      Excl. in GC? --    No data found.  Updated Vital Signs BP (!) 169/97 (BP Location: Left Arm)   Pulse 66   Temp 98.3 F (36.8 C) (Oral)   Resp 16   SpO2 95%   Visual Acuity Right Eye Distance:   Left Eye Distance:   Bilateral Distance:    Right Eye Near:   Left Eye Near:    Bilateral  Near:     Physical Exam Constitutional:      General: She is not in acute distress.    Appearance: She is well-developed.  HENT:     Head: Normocephalic and atraumatic.  Eyes:     Conjunctiva/sclera: Conjunctivae normal.     Pupils: Pupils are equal, round, and reactive to light.  Neck:     Musculoskeletal: Normal range of motion.  Cardiovascular:     Rate and Rhythm: Normal rate and regular rhythm.  Pulmonary:     Effort: Pulmonary effort is normal. No respiratory distress.     Breath sounds: Normal breath sounds.  Abdominal:     General: Abdomen is flat. There is no distension.     Palpations: Abdomen is soft.     Tenderness: There is no abdominal tenderness.  Musculoskeletal: Normal range of motion.  Skin:    General: Skin is warm and dry.  Neurological:     Mental Status: She is alert.      UC Treatments / Results  Labs (all labs ordered are listed, but only abnormal results are displayed) Labs Reviewed - No data to display  EKG   Radiology No results found.  Procedures Procedures (including critical care  time)  Medications Ordered in UC Medications - No data to display  Initial Impression / Assessment and Plan / UC Course  I have reviewed the triage vital signs and the nursing notes.  Pertinent labs & imaging results that were available during my care of the patient were reviewed by me and considered in my medical decision making (see chart for details).     Patient is asymptomatic.  Normal examination.  Return to work Final Clinical Impressions(s) / UC Diagnoses   Final diagnoses:  History of viral gastroenteritis  Encounter to obtain excuse from work     Discharge Instructions     Return as needed   ED Prescriptions    None     PDMP not reviewed this encounter.   Raylene Everts, MD 06/22/19 1037

## 2019-06-22 NOTE — Discharge Instructions (Signed)
Return as needed

## 2019-06-23 ENCOUNTER — Emergency Department (HOSPITAL_COMMUNITY)
Admission: EM | Admit: 2019-06-23 | Discharge: 2019-06-23 | Discharge status: Against Medical Advice | Payer: BLUE CROSS/BLUE SHIELD | Attending: Emergency Medicine | Admitting: Emergency Medicine

## 2019-06-23 ENCOUNTER — Other Ambulatory Visit: Payer: Self-pay

## 2019-06-23 ENCOUNTER — Encounter (HOSPITAL_COMMUNITY): Payer: Self-pay | Admitting: Emergency Medicine

## 2019-06-23 ENCOUNTER — Ambulatory Visit (INDEPENDENT_AMBULATORY_CARE_PROVIDER_SITE_OTHER)
Admission: EM | Admit: 2019-06-23 | Discharge: 2019-06-23 | Disposition: A | Discharge status: Home / Self Care | Payer: BLUE CROSS/BLUE SHIELD | Source: Home / Self Care | Attending: Family Medicine | Admitting: Family Medicine

## 2019-06-23 DIAGNOSIS — Z3202 Encounter for pregnancy test, result negative: Secondary | ICD-10-CM

## 2019-06-23 DIAGNOSIS — R1032 Left lower quadrant pain: Secondary | ICD-10-CM | POA: Diagnosis not present

## 2019-06-23 DIAGNOSIS — Z5321 Procedure and treatment not carried out due to patient leaving prior to being seen by health care provider: Secondary | ICD-10-CM | POA: Diagnosis not present

## 2019-06-23 DIAGNOSIS — K625 Hemorrhage of anus and rectum: Secondary | ICD-10-CM | POA: Diagnosis present

## 2019-06-23 DIAGNOSIS — K921 Melena: Secondary | ICD-10-CM

## 2019-06-23 LAB — COMPREHENSIVE METABOLIC PANEL
ALT: 11 U/L (ref 0–44)
AST: 14 U/L — ABNORMAL LOW (ref 15–41)
Albumin: 4 g/dL (ref 3.5–5.0)
Alkaline Phosphatase: 46 U/L (ref 38–126)
Anion gap: 12 (ref 5–15)
BUN: 7 mg/dL (ref 6–20)
CO2: 22 mmol/L (ref 22–32)
Calcium: 9.3 mg/dL (ref 8.9–10.3)
Chloride: 105 mmol/L (ref 98–111)
Creatinine, Ser: 1.06 mg/dL — ABNORMAL HIGH (ref 0.44–1.00)
GFR calc Af Amer: >60 mL/min (ref >60)
GFR calc non Af Amer: >60 mL/min (ref >60)
Glucose, Bld: 83 mg/dL (ref 70–99)
Potassium: 3.8 mmol/L (ref 3.5–5.1)
Sodium: 139 mmol/L (ref 135–145)
Total Bilirubin: 0.6 mg/dL (ref 0.3–1.2)
Total Protein: 7.3 g/dL (ref 6.5–8.1)

## 2019-06-23 LAB — POCT URINALYSIS DIP (DEVICE)
Bilirubin Urine: NEGATIVE
Glucose, UA: NEGATIVE mg/dL
Ketones, ur: NEGATIVE mg/dL
Nitrite: NEGATIVE
Protein, ur: NEGATIVE mg/dL
Specific Gravity, Urine: >=1.03 (ref 1.005–1.030)
Urobilinogen, UA: 0.2 mg/dL (ref 0.0–1.0)
pH: 6 (ref 5.0–8.0)

## 2019-06-23 LAB — ABO/RH: ABO/RH(D): AB POS

## 2019-06-23 LAB — TYPE AND SCREEN
ABO/RH(D): AB POS
Antibody Screen: NEGATIVE

## 2019-06-23 LAB — CBC
HCT: 40 % (ref 36.0–46.0)
Hemoglobin: 13.8 g/dL (ref 12.0–15.0)
MCH: 34.2 pg — ABNORMAL HIGH (ref 26.0–34.0)
MCHC: 34.5 g/dL (ref 30.0–36.0)
MCV: 99 fL (ref 80.0–100.0)
Platelets: 343 10*3/uL (ref 150–400)
RBC: 4.04 MIL/uL (ref 3.87–5.11)
RDW: 13 % (ref 11.5–15.5)
WBC: 8.6 10*3/uL (ref 4.0–10.5)
nRBC: 0 % (ref 0.0–0.2)

## 2019-06-23 LAB — POCT PREGNANCY, URINE: Preg Test, Ur: NEGATIVE

## 2019-06-23 LAB — I-STAT BETA HCG BLOOD, ED (MC, WL, AP ONLY): I-stat hCG, quantitative: <5 m[IU]/mL (ref <5)

## 2019-06-23 NOTE — ED Triage Notes (Addendum)
Started with rectal bleed , enough to fill a small cup dark tarry she states  Pain in left side of abd , pt is not on blood thinners

## 2019-06-23 NOTE — Discharge Instructions (Signed)
Go to the er for further evaluation of your symptoms.

## 2019-06-23 NOTE — ED Triage Notes (Signed)
Rectal bleeding started at 5 am.  Noticed tissue bloody and stool was dark.  No more stool.  Patient has pain on left side of abdomen.  Denies burning with urination. Recently has noticed increase in urine output.  Denies any history of dark stool

## 2019-06-23 NOTE — ED Notes (Signed)
Called pt for vitals, no answer x1 

## 2019-06-23 NOTE — ED Notes (Signed)
Called pt for room, no answer x3 

## 2019-06-23 NOTE — ED Notes (Signed)
Called pt for vitals, no answer x2 

## 2019-06-23 NOTE — ED Provider Notes (Signed)
[facility]    CSN: [id number] Arrival date & time: 06/23/19  1417      History   Chief Complaint Chief Complaint  Patient presents with  . Rectal Bleeding    HPI Nicole Blevins is a 48 y.o. female.   Nicole Blevins presents with complaints of dark black tarry stool as well as bright red blood with wiping after stool passage, which occurred today, and now with left sided abdominal pain. 10/11 had episode of vomiting and diarrhea, went to the ER and was treated with antiemetics and morphine in the ER. Was discharged home and felt improved. This morning woke at 0500 with need to pass a BM, noted it was black and tarry, and noted bright red blood to paper. States later today she felt that she needed to have another bm but could only pass gas. Pain to left abdomin. Pain 4/10. Has had increased urination primarily at night. No pain with urination. No nausea or vomiting today. Took peptobismol last 3 days ago but states she vomited it up. Has had stools since then which were soft, no black. No pain with passing a BM. No rectal pain. No previous colonoscopy. She does smoke. No fevers. States she feels her mother has had a history of ulcers but no personal history of any ulcers. Denies any dizziness.     ROS per HPI, negative if not otherwise mentioned.      Past Medical History:  Diagnosis Date  . Seizures ([place])     There are no active problems to display for this patient.   History reviewed. No pertinent surgical history.  OB History   No obstetric history on file.      Home Medications    Prior to Admission medications   Medication Sig Start Date End Date Taking? Authorizing Provider  b complex vitamins capsule Take 1 capsule by mouth daily.   Yes [provider]  NON FORMULARY Vitamin for hair loss   Yes [provider]    Family History Family History  Problem Relation Age of Onset  . Diabetes Mother   . Hypertension Mother   .  Aortic aneurysm Father   . Stroke Father   . Hypertension Father     Social History Social History   Tobacco Use  . Smoking status: Current Every Day Smoker    Packs/day: 0.50    Types: Cigarettes  . Smokeless tobacco: Never Used  Substance Use Topics  . Alcohol use: Not Currently  . Drug use: Yes    Types: Marijuana    Comment: every other day     Allergies   Patient has no known allergies.   Review of Systems Review of Systems   Physical Exam Triage Vital Signs ED Triage Vitals  Enc Vitals Group     BP 06/23/19 1451 (!) 175/102     Pulse Rate 06/23/19 1451 72     Resp 06/23/19 1451 18     Temp 06/23/19 1451 98.5 F (36.9 C)     Temp Source 06/23/19 1451 Oral     SpO2 06/23/19 1451 100 %     Weight --      Height --      Head Circumference --      Peak Flow --      Pain Score 06/23/19 1445 4     Pain Loc --      Pain Edu? --      Excl. in [place]? --  No data found.  Updated Vital Signs BP (!) 175/102 (BP Location: Right Arm)   Pulse 72   Temp 98.5 F (36.9 C) (Oral)   Resp 18   LMP 06/04/2019   SpO2 100%    Physical Exam Constitutional:      General: She is not in acute distress.    Appearance: She is well-developed.  Cardiovascular:     Rate and Rhythm: Normal rate.  Pulmonary:     Effort: Pulmonary effort is normal.  Abdominal:     Tenderness: There is abdominal tenderness. There is no guarding or rebound.       Comments: Left abdomen and LLQ tenderness on palpation  Skin:    General: Skin is warm and dry.  Neurological:     Mental Status: She is alert and oriented to person, place, and time.      UC Treatments / Results  Labs (all labs ordered are listed, but only abnormal results are displayed) Labs Reviewed  POCT URINALYSIS DIP (DEVICE) - Abnormal; Notable for the following components:      Result Value   Hgb urine dipstick MODERATE (*)    Leukocytes,Ua TRACE (*)    All other components within normal limits  POCT  PREGNANCY, URINE    EKG   Radiology No results found.  Procedures Procedures (including critical care time)  Medications Ordered in UC Medications - No data to display  Initial Impression / Assessment and Plan / UC Course  I have reviewed the triage vital signs and the nursing notes.  Pertinent labs & imaging results that were available during my care of the patient were reviewed by me and considered in my medical decision making (see chart for details).     New onset abdominal pain with new black stools. Recent gastroenteritis, vomiting has since resolved. Noted mild leukocytes in er on 10/11 with elevation in neuts as well. Afebrile. I do feel with this change in status further evaluation is warranted. No previous colonoscopies. Recommended return to the ER at this time. Bp stable, ambulatory without difficulty. Self transport to the ER. Patient verbalized understanding and agreeable to plan.   Final Clinical Impressions(s) / UC Diagnoses   Final diagnoses:  Abdominal pain, left lower quadrant  Melena     Discharge Instructions     Go to the er for further evaluation of your symptoms.     ED Prescriptions    None     PDMP not reviewed this encounter.   Georgetta Haber, NP 06/23/19 1538
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
## 2020-04-14 IMAGING — DX DG CHEST 1V PORT
1 series · 1 of 1 positions shown · non-contrast
Comparison: November 30, 2014.

CLINICAL DATA: Cough.

EXAM:
PORTABLE CHEST 1 VIEW

[chest ap]
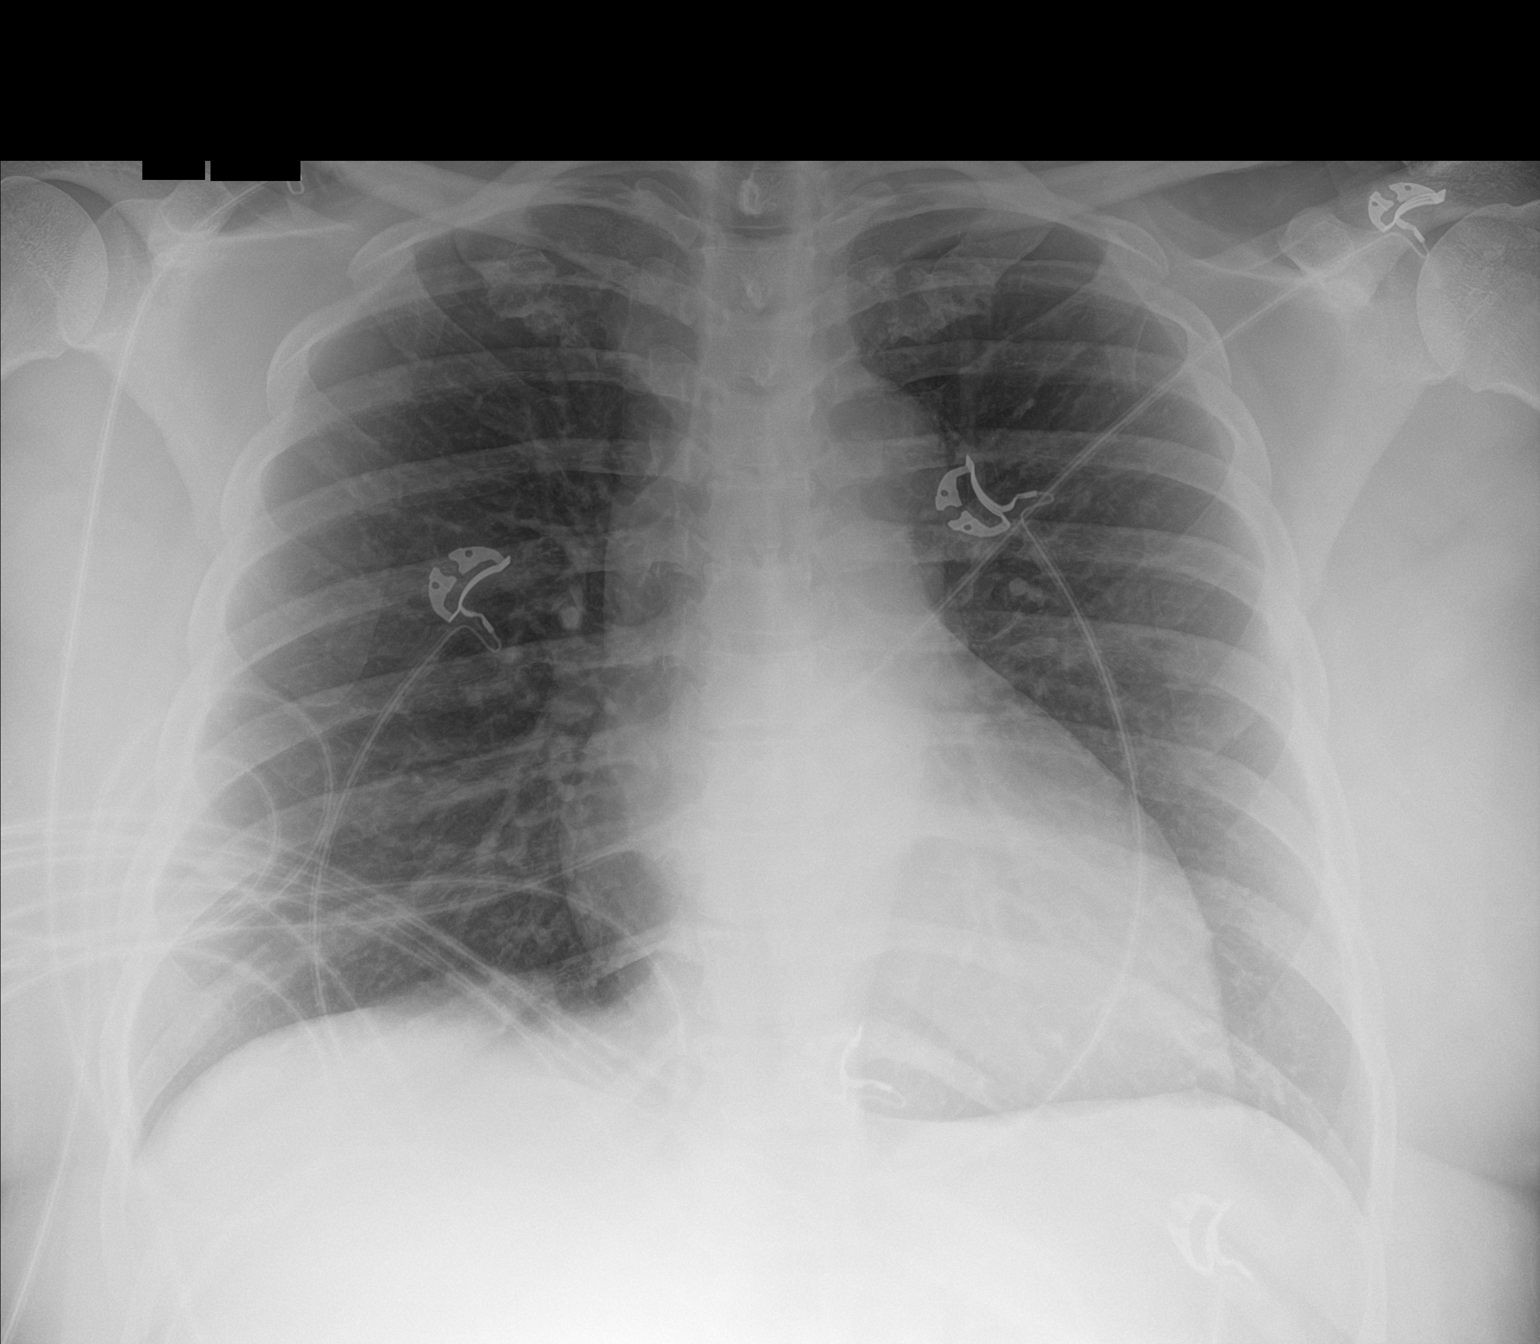

[1 of 1 positions shown; findings below may reference images not displayed]

FINDINGS: The heart size and mediastinal contours are within normal limits.
Both lungs are clear. The visualized skeletal structures are
unremarkable.
IMPRESSION: No active disease.

## 2020-04-15 ENCOUNTER — Emergency Department (HOSPITAL_COMMUNITY)
Admission: EM | Admit: 2020-04-15 | Discharge: 2020-04-15 | Disposition: A | Discharge status: Home / Self Care | Payer: 59 | Attending: Emergency Medicine | Admitting: Emergency Medicine

## 2020-04-15 ENCOUNTER — Encounter (HOSPITAL_COMMUNITY): Payer: Self-pay | Admitting: Emergency Medicine

## 2020-04-15 ENCOUNTER — Other Ambulatory Visit: Payer: Self-pay

## 2020-04-15 DIAGNOSIS — F1290 Cannabis use, unspecified, uncomplicated: Secondary | ICD-10-CM | POA: Diagnosis not present

## 2020-04-15 DIAGNOSIS — R112 Nausea with vomiting, unspecified: Secondary | ICD-10-CM | POA: Diagnosis not present

## 2020-04-15 DIAGNOSIS — R197 Diarrhea, unspecified: Secondary | ICD-10-CM | POA: Insufficient documentation

## 2020-04-15 DIAGNOSIS — F17210 Nicotine dependence, cigarettes, uncomplicated: Secondary | ICD-10-CM | POA: Diagnosis not present

## 2020-04-15 DIAGNOSIS — K529 Noninfective gastroenteritis and colitis, unspecified: Secondary | ICD-10-CM

## 2020-04-15 LAB — COMPREHENSIVE METABOLIC PANEL
ALT: 11 U/L (ref 0–44)
AST: 14 U/L — ABNORMAL LOW (ref 15–41)
Albumin: 3.8 g/dL (ref 3.5–5.0)
Alkaline Phosphatase: 45 U/L (ref 38–126)
Anion gap: 10 (ref 5–15)
BUN: 8 mg/dL (ref 6–20)
CO2: 22 mmol/L (ref 22–32)
Calcium: 8.9 mg/dL (ref 8.9–10.3)
Chloride: 105 mmol/L (ref 98–111)
Creatinine, Ser: 0.94 mg/dL (ref 0.44–1.00)
GFR calc Af Amer: >60 mL/min (ref >60)
GFR calc non Af Amer: >60 mL/min (ref >60)
Glucose, Bld: 140 mg/dL — ABNORMAL HIGH (ref 70–99)
Potassium: 3.5 mmol/L (ref 3.5–5.1)
Sodium: 137 mmol/L (ref 135–145)
Total Bilirubin: 0.5 mg/dL (ref 0.3–1.2)
Total Protein: 6.9 g/dL (ref 6.5–8.1)

## 2020-04-15 LAB — URINALYSIS, ROUTINE W REFLEX MICROSCOPIC
Bilirubin Urine: NEGATIVE
Glucose, UA: NEGATIVE mg/dL
Ketones, ur: NEGATIVE mg/dL
Leukocytes,Ua: NEGATIVE
Nitrite: NEGATIVE
Protein, ur: NEGATIVE mg/dL
RBC / HPF: >50 RBC/hpf — ABNORMAL HIGH (ref 0–5)
Specific Gravity, Urine: 1.017 (ref 1.005–1.030)
pH: 8 (ref 5.0–8.0)

## 2020-04-15 LAB — CBC
HCT: 35.3 % — ABNORMAL LOW (ref 36.0–46.0)
Hemoglobin: 11 g/dL — ABNORMAL LOW (ref 12.0–15.0)
MCH: 29.3 pg (ref 26.0–34.0)
MCHC: 31.2 g/dL (ref 30.0–36.0)
MCV: 94.1 fL (ref 80.0–100.0)
Platelets: 409 10*3/uL — ABNORMAL HIGH (ref 150–400)
RBC: 3.75 MIL/uL — ABNORMAL LOW (ref 3.87–5.11)
RDW: 14.6 % (ref 11.5–15.5)
WBC: 9.8 10*3/uL (ref 4.0–10.5)
nRBC: 0 % (ref 0.0–0.2)

## 2020-04-15 LAB — I-STAT BETA HCG BLOOD, ED (MC, WL, AP ONLY): I-stat hCG, quantitative: <5 m[IU]/mL (ref <5)

## 2020-04-15 LAB — LIPASE, BLOOD: Lipase: 20 U/L (ref 11–51)

## 2020-04-15 MED ORDER — ONDANSETRON 4 MG PO TBDP
4.0000 mg | ORAL_TABLET | Freq: Once | ORAL | Status: AC | PRN
  Administered 2020-04-15: 4 mg via ORAL
  Filled 2020-04-15: qty 1

## 2020-04-15 MED ORDER — SODIUM CHLORIDE 0.9% FLUSH: 3.0000 mL | Freq: Once | INTRAVENOUS | Status: DC

## 2020-04-15 MED ORDER — PENICILLIN V POTASSIUM 500 MG PO TABS
500.0000 mg | ORAL_TABLET | Freq: Four times a day (QID) | ORAL | 0 refills | Status: AC
Start: 2020-04-15 — End: 2020-04-22

## 2020-04-15 MED ORDER — ONDANSETRON 4 MG PO TBDP
4.0000 mg | ORAL_TABLET | Freq: Three times a day (TID) | ORAL | 0 refills | Status: DC | PRN
Start: 2020-04-15 — End: 2021-06-25

## 2020-04-15 NOTE — ED Provider Notes (Signed)
[facility] Emergency Department Provider Note MRN:  [id number]  Arrival date & time: 04/15/20     Chief Complaint   Emesis and Diarrhea   History of Present Illness   Nicole Blevins is a 49 y.o. year-old female with a history of seizure disorder presenting to the ED with chief complaint of emesis and diarrhea.  Patient ate seafood from [employer] restaurant yesterday evening for dinner.  She woke up at 3 or 4 AM with nausea, vomiting, diarrhea.  Has noted small amount of blood streaks in the diarrhea once or twice.  No blood in the vomit.  No significant abdominal pain, no chest pain or shortness of breath, no fever but does feel hot all over when she is vomiting.  Symptoms mild to moderate, constant, no exacerbating or alleviating factors.  Review of Systems  A complete 10 system review of systems was obtained and all systems are negative except as noted in the HPI and PMH.   Patient's Health History    Past Medical History:  Diagnosis Date  . Seizures ([facility])     History reviewed. No pertinent surgical history.  Family History  Problem Relation Age of Onset  . Diabetes Mother   . Hypertension Mother   . Aortic aneurysm Father   . Stroke Father   . Hypertension Father     Social History   Socioeconomic History  . Marital status: Legally Separated    Spouse name: Not on file  . Number of children: Not on file  . Years of education: Not on file  . Highest education level: Not on file  Occupational History  . Not on file  Tobacco Use  . Smoking status: Current Every Day Smoker    Packs/day: 0.50    Types: Cigarettes  . Smokeless tobacco: Never Used  Substance and Sexual Activity  . Alcohol use: Not Currently  . Drug use: Yes    Types: Marijuana    Comment: every other day  . Sexual activity: Not on file  Other Topics Concern  . Not on file  Social History Narrative  . Not on file   Social Determinants of Health   Financial Resource Strain:     . Difficulty of Paying Living Expenses:   Food Insecurity:   . Worried About Programme researcher, broadcasting/film/video in the Last Year:   . Barista in the Last Year:   Transportation Needs:   . Freight forwarder (Medical):   Marland Kitchen Lack of Transportation (Non-Medical):   Physical Activity:   . Days of Exercise per Week:   . Minutes of Exercise per Session:   Stress:   . Feeling of Stress :   Social Connections:   . Frequency of Communication with Friends and Family:   . Frequency of Social Gatherings with Friends and Family:   . Attends Religious Services:   . Active Member of Clubs or Organizations:   . Attends Banker Meetings:   Marland Kitchen Marital Status:   Intimate Partner Violence:   . Fear of Current or Ex-Partner:   . Emotionally Abused:   Marland Kitchen Physically Abused:   . Sexually Abused:      Physical Exam   Vitals:   04/15/20 0709 04/15/20 0941  BP: (!) 181/89 (!) 187/102  Pulse: 66 73  Resp: 18 17  Temp: 98 F (36.7 C)   SpO2: 100% 100%    CONSTITUTIONAL: Well-appearing, NAD NEURO:  Alert and oriented x 3, no focal deficits  EYES:  eyes equal and reactive ENT/NECK:  no LAD, no JVD CARDIO: Regular rate, well-perfused, normal S1 and S2 PULM:  CTAB no wheezing or rhonchi GI/GU:  normal bowel sounds, non-distended, non-tender MSK/SPINE:  No gross deformities, no edema SKIN:  no rash, atraumatic PSYCH:  Appropriate speech and behavior  *Additional and/or pertinent findings included in MDM below  Diagnostic and Interventional Summary    EKG Interpretation  Date/Time:    Ventricular Rate:    PR Interval:    QRS Duration:   QT Interval:    QTC Calculation:   R Axis:     Text Interpretation:        Labs Reviewed  COMPREHENSIVE METABOLIC PANEL - Abnormal; Notable for the following components:      Result Value   Glucose, Bld 140 (*)    AST 14 (*)    All other components within normal limits  CBC - Abnormal; Notable for the following components:   RBC 3.75 (*)     Hemoglobin 11.0 (*)    HCT 35.3 (*)    Platelets 409 (*)    All other components within normal limits  URINALYSIS, ROUTINE W REFLEX MICROSCOPIC - Abnormal; Notable for the following components:   APPearance HAZY (*)    Hgb urine dipstick LARGE (*)    RBC / HPF >50 (*)    Bacteria, UA RARE (*)    All other components within normal limits  LIPASE, BLOOD  I-STAT BETA HCG BLOOD, ED ([facility], [facility], [facility] ONLY)    No orders to display    Medications  sodium chloride flush (NS) 0.9 % injection 3 mL (has no administration in time range)  ondansetron (ZOFRAN-ODT) disintegrating tablet 4 mg (4 mg Oral Given 04/15/20 0725)     Procedures  /  Critical Care Procedures  ED Course and Medical Decision Making  I have reviewed the triage vital signs, the nursing notes, and pertinent available records from the EMR.  Listed above are laboratory and imaging tests that I personally ordered, reviewed, and interpreted and then considered in my medical decision making (see below for details).      Consistent with viral gastroenteritis or food poisoning, reassuring vital signs, soft and nontender abdomen with no rebound, no guarding, no rigidity, no focal tenderness.  No indication for imaging at this time, laboratory assessment is reassuring, patient is overall feeling better, she is tolerating p.o., she is appropriate for discharge.  Patient also has a painful tooth that on exam is with significant decay and needs extraction, there is no contiguous significant infection or abscess, but I do suspect a tooth infection she would benefit from antibiotics and dental follow-up.    Elmer Sow. Pilar Plate, MD [facility] Emergency Medicine [facility] [email or web address]  Final Clinical Impressions(s) / ED Diagnoses     ICD-10-CM   1. Nausea vomiting and diarrhea  R11.2    R19.7   2. Gastroenteritis  K52.9     ED Discharge Orders         Ordered    ondansetron (ZOFRAN ODT) 4 MG disintegrating  tablet  Every 8 hours PRN     Discontinue  Reprint     04/15/20 1120    penicillin v potassium (VEETID) 500 MG tablet  4 times daily     Discontinue  Reprint     04/15/20 1120           Discharge Instructions Discussed with and Provided to Patient:     Discharge  Instructions     You were evaluated in the Emergency Department and after careful evaluation, we did not find any emergent condition requiring admission or further testing in the hospital.  Your exam/testing today was overall reassuring.  Your symptoms seem to be due to food poisoning or a mild infection of the intestines from a virus.  We recommend plenty of fluids at home, you can use the Zofran as needed for nausea.  You can use over-the-counter Imodium as needed for diarrhea.  You should be feeling better within the next few days.  Regarding your painful tooth, we recommend taking the penicillin antibiotic as directed and following up with a dentist for likely extraction of the tooth.  Please return to the Emergency Department if you experience any worsening of your condition.  Thank you for allowing [place] to be a part of your care.       Sabas Sous, MD 04/15/20 1124

## 2020-04-15 NOTE — Discharge Instructions (Addendum)
You were evaluated in the Emergency Department and after careful evaluation, we did not find any emergent condition requiring admission or further testing in the hospital.  Your exam/testing today was overall reassuring.  Your symptoms seem to be due to food poisoning or a mild infection of the intestines from a virus.  We recommend plenty of fluids at home, you can use the Zofran as needed for nausea.  You can use over-the-counter Imodium as needed for diarrhea.  You should be feeling better within the next few days.  Regarding your painful tooth, we recommend taking the penicillin antibiotic as directed and following up with a dentist for likely extraction of the tooth.  Please return to the Emergency Department if you experience any worsening of your condition.  Thank you for allowing [place] to be a part of your care.

## 2020-04-15 NOTE — ED Triage Notes (Signed)
Pt in w/emesis and diarrhea since waking at 0400. Says the last thing she ate was [employer] yesterday evening. Woke up w/intermittent R sided pain. Is currently having cramps from cycle as well. Denies any fevers

## 2020-04-15 NOTE — ED Notes (Signed)
Patient given discharge instructions. Questions were answered. Patient verbalized understanding of discharge instructions and care at home.  

## 2021-06-25 ENCOUNTER — Emergency Department (HOSPITAL_COMMUNITY)
Admission: EM | Admit: 2021-06-25 | Discharge: 2021-06-25 | Disposition: A | Discharge status: Home / Self Care | Payer: 59 | Attending: Emergency Medicine | Admitting: Emergency Medicine

## 2021-06-25 DIAGNOSIS — R110 Nausea: Secondary | ICD-10-CM | POA: Diagnosis not present

## 2021-06-25 DIAGNOSIS — F17210 Nicotine dependence, cigarettes, uncomplicated: Secondary | ICD-10-CM | POA: Insufficient documentation

## 2021-06-25 DIAGNOSIS — K625 Hemorrhage of anus and rectum: Secondary | ICD-10-CM | POA: Diagnosis not present

## 2021-06-25 DIAGNOSIS — K029 Dental caries, unspecified: Secondary | ICD-10-CM

## 2021-06-25 DIAGNOSIS — R1084 Generalized abdominal pain: Secondary | ICD-10-CM

## 2021-06-25 DIAGNOSIS — R1032 Left lower quadrant pain: Secondary | ICD-10-CM

## 2021-06-25 DIAGNOSIS — J029 Acute pharyngitis, unspecified: Secondary | ICD-10-CM

## 2021-06-25 LAB — URINALYSIS, ROUTINE W REFLEX MICROSCOPIC
Bilirubin Urine: NEGATIVE
Glucose, UA: NEGATIVE mg/dL
Ketones, ur: NEGATIVE mg/dL
Leukocytes,Ua: NEGATIVE
Nitrite: NEGATIVE
Protein, ur: NEGATIVE mg/dL
Specific Gravity, Urine: 1.016 (ref 1.005–1.030)
pH: 6 (ref 5.0–8.0)

## 2021-06-25 LAB — CBC
HCT: 32.3 % — ABNORMAL LOW (ref 36.0–46.0)
Hemoglobin: 9.9 g/dL — ABNORMAL LOW (ref 12.0–15.0)
MCH: 27.1 pg (ref 26.0–34.0)
MCHC: 30.7 g/dL (ref 30.0–36.0)
MCV: 88.5 fL (ref 80.0–100.0)
Platelets: 395 10*3/uL (ref 150–400)
RBC: 3.65 MIL/uL — ABNORMAL LOW (ref 3.87–5.11)
RDW: 17.2 % — ABNORMAL HIGH (ref 11.5–15.5)
WBC: 4.6 10*3/uL (ref 4.0–10.5)
nRBC: 0 % (ref 0.0–0.2)

## 2021-06-25 LAB — COMPREHENSIVE METABOLIC PANEL
ALT: 14 U/L (ref 0–44)
AST: 18 U/L (ref 15–41)
Albumin: 3.3 g/dL — ABNORMAL LOW (ref 3.5–5.0)
Alkaline Phosphatase: 36 U/L — ABNORMAL LOW (ref 38–126)
Anion gap: 6 (ref 5–15)
BUN: 9 mg/dL (ref 6–20)
CO2: 27 mmol/L (ref 22–32)
Calcium: 9.1 mg/dL (ref 8.9–10.3)
Chloride: 103 mmol/L (ref 98–111)
Creatinine, Ser: 0.77 mg/dL (ref 0.44–1.00)
GFR, Estimated: >60 mL/min (ref >60)
Glucose, Bld: 109 mg/dL — ABNORMAL HIGH (ref 70–99)
Potassium: 2.9 mmol/L — ABNORMAL LOW (ref 3.5–5.1)
Sodium: 136 mmol/L (ref 135–145)
Total Bilirubin: 0.2 mg/dL — ABNORMAL LOW (ref 0.3–1.2)
Total Protein: 6.4 g/dL — ABNORMAL LOW (ref 6.5–8.1)

## 2021-06-25 LAB — POC OCCULT BLOOD, ED: Fecal Occult Bld: NEGATIVE

## 2021-06-25 LAB — I-STAT BETA HCG BLOOD, ED (MC, WL, AP ONLY): I-stat hCG, quantitative: <5 m[IU]/mL (ref <5)

## 2021-06-25 LAB — LIPASE, BLOOD: Lipase: 29 U/L (ref 11–51)

## 2021-06-25 MED ORDER — PENICILLIN V POTASSIUM 500 MG PO TABS
500.0000 mg | ORAL_TABLET | Freq: Four times a day (QID) | ORAL | 0 refills | Status: AC
Start: 2021-06-25 — End: 2021-07-02

## 2021-06-25 NOTE — ED Triage Notes (Signed)
Pt. Stated, this has been going on for 2 years off and on.

## 2021-06-25 NOTE — Discharge Instructions (Addendum)
Your blood count is somewhat lower than previously.  Make sure you are getting plenty of rest drink a lot of fluids and eat a regular balanced diet.  If your condition worsens with pain, weakness or dizziness, return to the ED.  Otherwise call the gastroenterologist for follow-up appointment about the abdominal pain and rectal bleeding.  We are giving a prescription to treat the soreness in your throat.  See your primary care doctor for ongoing care and treatment as needed.

## 2021-06-25 NOTE — ED Triage Notes (Signed)
Pt. Stated, Nicole Blevins had rectal bleeding off and on with stomach pain N/V. Hurts in the lower back flank area.

## 2021-06-25 NOTE — ED Provider Notes (Signed)
[facility] EMERGENCY DEPARTMENT Provider Note   CSN: [id number] Arrival date & time: 06/25/21  5498     History Chief Complaint  Patient presents with   Abdominal Pain   Nausea   Rectal Bleeding    Nicole Blevins is a 50 y.o. female.  HPI She presents for evaluation of abdominal discomfort and intermittent rectal bleeding for 2 years.  She also complains of a knot in the left groin region.  She denies fever, chills, vomiting, diarrhea, dizziness or weakness.  There are no other known active modifying factors.    Past Medical History:  Diagnosis Date   Seizures ([place])     There are no problems to display for this patient.   No past surgical history on file.   OB History   No obstetric history on file.     Family History  Problem Relation Age of Onset   Diabetes Mother    Hypertension Mother    Aortic aneurysm Father    Stroke Father    Hypertension Father     Social History   Tobacco Use   Smoking status: Every Day    Packs/day: 0.50    Types: Cigarettes   Smokeless tobacco: Never  Substance Use Topics   Alcohol use: Not Currently   Drug use: Yes    Types: Marijuana    Comment: every other day    Home Medications Prior to Admission medications   Medication Sig Start Date End Date Taking? Authorizing Provider  penicillin v potassium (VEETID) 500 MG tablet Take 1 tablet (500 mg total) by mouth 4 (four) times daily for 7 days. 06/25/21 07/02/21 Yes Daleen Bo, MD    Allergies    Patient has no known allergies.  Review of Systems   Review of Systems  All other systems reviewed and are negative.  Physical Exam Updated Vital Signs BP (!) 135/91   Pulse 93   Temp 98.5 F (36.9 C) (Oral)   Resp (!) 21   LMP 06/05/2021   SpO2 99%   Physical Exam Vitals and nursing note reviewed.  Constitutional:      General: She is not in acute distress.    Appearance: She is well-developed. She is not ill-appearing, toxic-appearing  or diaphoretic.  HENT:     Head: Normocephalic and atraumatic.     Right Ear: External ear normal.     Left Ear: External ear normal.  Eyes:     Conjunctiva/sclera: Conjunctivae normal.     Pupils: Pupils are equal, round, and reactive to light.  Neck:     Trachea: Phonation normal.  Cardiovascular:     Rate and Rhythm: Normal rate and regular rhythm.     Heart sounds: Normal heart sounds.  Pulmonary:     Effort: Pulmonary effort is normal.     Breath sounds: Normal breath sounds.  Abdominal:     General: There is no distension.     Palpations: Abdomen is soft.     Tenderness: There is no abdominal tenderness.     Comments: Trivial adenopathy, left groin, 5 cm, few palpable nodes.  Note that the mons pubis has been shaved.  No associated pustules, drainage or other sites of tenderness.  No palpable hernia.  Genitourinary:    Comments: Normal anus.  Small amount of brown stool in rectal vault.  No rectal mass or impaction.  No anal fissure or hemorrhoids either seen or palpated Musculoskeletal:        General: Normal range  of motion.     Cervical back: Normal range of motion and neck supple.  Skin:    General: Skin is warm and dry.  Neurological:     Mental Status: She is alert and oriented to person, place, and time.     Cranial Nerves: No cranial nerve deficit.     Sensory: No sensory deficit.     Motor: No abnormal muscle tone.     Coordination: Coordination normal.  Psychiatric:        Mood and Affect: Mood normal.        Behavior: Behavior normal.        Thought Content: Thought content normal.        Judgment: Judgment normal.    ED Results / Procedures / Treatments   Labs (all labs ordered are listed, but only abnormal results are displayed) Labs Reviewed  COMPREHENSIVE METABOLIC PANEL - Abnormal; Notable for the following components:      Result Value   Potassium 2.9 (*)    Glucose, Bld 109 (*)    Total Protein 6.4 (*)    Albumin 3.3 (*)    Alkaline  Phosphatase 36 (*)    Total Bilirubin 0.2 (*)    All other components within normal limits  CBC - Abnormal; Notable for the following components:   RBC 3.65 (*)    Hemoglobin 9.9 (*)    HCT 32.3 (*)    RDW 17.2 (*)    All other components within normal limits  URINALYSIS, ROUTINE W REFLEX MICROSCOPIC - Abnormal; Notable for the following components:   APPearance HAZY (*)    Hgb urine dipstick MODERATE (*)    Bacteria, UA RARE (*)    All other components within normal limits  LIPASE, BLOOD  I-STAT BETA HCG BLOOD, ED ([facility], [facility], [facility] ONLY)  POC OCCULT BLOOD, ED    EKG None  Radiology No results found.  Procedures Procedures   Medications Ordered in ED Medications - No data to display  ED Course  I have reviewed the triage vital signs and the nursing notes.  Pertinent labs & imaging results that were available during my care of the patient were reviewed by me and considered in my medical decision making (see chart for details).    MDM Rules/Calculators/A&P                            Patient Vitals for the past 24 hrs:  BP Temp Temp src Pulse Resp SpO2  06/25/21 1330 -- 98.5 F (36.9 C) Oral -- -- --  06/25/21 1245 (!) 135/91 -- -- 93 (!) 21 99 %  06/25/21 1230 (!) 151/86 -- -- 77 (!) 21 100 %  06/25/21 1125 (!) 131/99 -- -- 69 17 100 %  06/25/21 0818 (!) 145/105 97.7 F (36.5 C) Oral 74 18 100 %   At the time of discharge -reevaluation with update and discussion. After initial assessment and treatment, an updated evaluation reveals she is comfortable has no further complaints.Daleen Bo   Medical Decision Making:  This patient is presenting for evaluation of abdominal pain with visualized rectal bleeding and groin sore, which does require a range of treatment options, and is a complaint that involves a moderate risk of morbidity and mortality. The differential diagnoses include intestinal infection, colonic bleeding, upper GI bleed. I decided to review old  records, and in summary millage female presenting with chronic symptoms 2 years of abdominal pain  and rectal bleeding.  She also has acute complaint of left groin swelling..  I did not require additional historical information from anyone.  Clinical Laboratory Tests Ordered, included CBC, Metabolic panel, Urinalysis, Pregnancy test, and occult blood stool . Review indicates normal except hemoglobin low, potassium low, glucose high, total protein low, albumin low, alk phos stays close, urinalysis with blood.      Critical Interventions-clinical evaluation, laboratory testing, observation and reassessment  After These Interventions, the Patient was reevaluated and was found she has nonspecific findings with mildly low hemoglobin.  Intermittent abdominal pain and bleeding for 2 years.  Hemodynamically stable.  She has carious teeth and tenderness and sore throat.  She will be covered with antibiotic for possible dental infection.  Nonspecific left groin pain secondary to lymphadenopathy, with nonspecific etiology.  I explained to the patient that this is sometimes inflammatory can be from shaving the pelvic area.  She was not comfortable with this diagnosis I attempted to reassure her that there was nothing wrong with shaving, only that the adenopathy is nonspecific and does not need further assessment, or treatment at this time.  She will be referred to GI for ongoing management and evaluation of abdominal pain with rectal bleeding.  She likely needs a colonoscopy.  CRITICAL CARE-no Performed by: Daleen Bo  Nursing Notes Reviewed/ Care Coordinated Applicable Imaging Reviewed Interpretation of Laboratory Data incorporated into ED treatment  The patient appears reasonably screened and/or stabilized for discharge and I doubt any other medical condition or other [facility] requiring further screening, evaluation, or treatment in the ED at this time prior to discharge.  Plan: Home Medications-continue  usual; Home Treatments-rest, fluids; return here if the recommended treatment, does not improve the symptoms; Recommended follow up-GI follow-up as needed.     Final Clinical Impression(s) / ED Diagnoses Final diagnoses:  Rectal bleeding  Left inguinal pain  Dental caries  Sore throat  Generalized abdominal pain    Rx / DC Orders ED Discharge Orders          Ordered    penicillin v potassium (VEETID) 500 MG tablet  4 times daily        06/25/21 1327             Daleen Bo, MD 06/25/21 2118

## 2022-09-15 ENCOUNTER — Emergency Department (HOSPITAL_COMMUNITY): Payer: Commercial Managed Care - HMO

## 2022-09-15 ENCOUNTER — Emergency Department (HOSPITAL_COMMUNITY)
Admission: EM | Admit: 2022-09-15 | Discharge: 2022-09-15 | Disposition: A | Discharge status: Home / Self Care | Payer: Commercial Managed Care - HMO | Attending: Emergency Medicine | Admitting: Emergency Medicine

## 2022-09-15 ENCOUNTER — Other Ambulatory Visit: Payer: Self-pay

## 2022-09-15 DIAGNOSIS — S0990XA Unspecified injury of head, initial encounter: Secondary | ICD-10-CM | POA: Diagnosis not present

## 2022-09-15 DIAGNOSIS — R6884 Jaw pain: Secondary | ICD-10-CM | POA: Diagnosis not present

## 2022-09-15 DIAGNOSIS — R519 Headache, unspecified: Secondary | ICD-10-CM | POA: Insufficient documentation

## 2022-09-15 DIAGNOSIS — M79672 Pain in left foot: Secondary | ICD-10-CM | POA: Diagnosis not present

## 2022-09-15 DIAGNOSIS — R0689 Other abnormalities of breathing: Secondary | ICD-10-CM | POA: Diagnosis not present

## 2022-09-15 DIAGNOSIS — R7309 Other abnormal glucose: Secondary | ICD-10-CM | POA: Insufficient documentation

## 2022-09-15 DIAGNOSIS — M25561 Pain in right knee: Secondary | ICD-10-CM | POA: Insufficient documentation

## 2022-09-15 DIAGNOSIS — S0993XA Unspecified injury of face, initial encounter: Secondary | ICD-10-CM | POA: Diagnosis not present

## 2022-09-15 DIAGNOSIS — I10 Essential (primary) hypertension: Secondary | ICD-10-CM | POA: Diagnosis not present

## 2022-09-15 LAB — CBC
HCT: 26.9 % — ABNORMAL LOW (ref 36.0–46.0)
Hemoglobin: 8.3 g/dL — ABNORMAL LOW (ref 12.0–15.0)
MCH: 25.8 pg — ABNORMAL LOW (ref 26.0–34.0)
MCHC: 30.9 g/dL (ref 30.0–36.0)
MCV: 83.5 fL (ref 80.0–100.0)
Platelets: 473 10*3/uL — ABNORMAL HIGH (ref 150–400)
RBC: 3.22 MIL/uL — ABNORMAL LOW (ref 3.87–5.11)
RDW: 16.9 % — ABNORMAL HIGH (ref 11.5–15.5)
WBC: 8.2 10*3/uL (ref 4.0–10.5)
nRBC: 0 % (ref 0.0–0.2)

## 2022-09-15 LAB — COMPREHENSIVE METABOLIC PANEL
ALT: 17 U/L (ref 0–44)
AST: 24 U/L (ref 15–41)
Albumin: 3.6 g/dL (ref 3.5–5.0)
Alkaline Phosphatase: 46 U/L (ref 38–126)
Anion gap: 6 (ref 5–15)
BUN: 9 mg/dL (ref 6–20)
CO2: 24 mmol/L (ref 22–32)
Calcium: 9.2 mg/dL (ref 8.9–10.3)
Chloride: 104 mmol/L (ref 98–111)
Creatinine, Ser: 1.04 mg/dL — ABNORMAL HIGH (ref 0.44–1.00)
GFR, Estimated: >60 mL/min (ref >60)
Glucose, Bld: 102 mg/dL — ABNORMAL HIGH (ref 70–99)
Potassium: 4.3 mmol/L (ref 3.5–5.1)
Sodium: 134 mmol/L — ABNORMAL LOW (ref 135–145)
Total Bilirubin: 0.6 mg/dL (ref 0.3–1.2)
Total Protein: 6.8 g/dL (ref 6.5–8.1)

## 2022-09-15 LAB — I-STAT BETA HCG BLOOD, ED (MC, WL, AP ONLY): I-stat hCG, quantitative: 6.3 m[IU]/mL — ABNORMAL HIGH (ref <5)

## 2022-09-15 LAB — TROPONIN I (HIGH SENSITIVITY)
Troponin I (High Sensitivity): 10 ng/L (ref <18)
Troponin I (High Sensitivity): 10 ng/L (ref <18)

## 2022-09-15 LAB — BRAIN NATRIURETIC PEPTIDE: B Natriuretic Peptide: 35.6 pg/mL (ref 0.0–100.0)

## 2022-09-15 LAB — CBG MONITORING, ED: Glucose-Capillary: 108 mg/dL — ABNORMAL HIGH (ref 70–99)

## 2022-09-15 LAB — PREGNANCY, URINE: Preg Test, Ur: NEGATIVE

## 2022-09-15 MED ORDER — ACETAMINOPHEN 500 MG PO TABS
1000.0000 mg | ORAL_TABLET | ORAL | Status: AC
  Administered 2022-09-15: 1000 mg via ORAL
  Filled 2022-09-15: qty 2

## 2022-09-15 NOTE — Discharge Instructions (Addendum)
Return for any problem.   Use Tylenol as instructed for pain control.
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
## 2022-12-17 ENCOUNTER — Encounter (HOSPITAL_COMMUNITY): Payer: Self-pay

## 2022-12-17 ENCOUNTER — Ambulatory Visit (INDEPENDENT_AMBULATORY_CARE_PROVIDER_SITE_OTHER): Payer: Medicaid Other

## 2022-12-17 ENCOUNTER — Ambulatory Visit (HOSPITAL_COMMUNITY)
Admission: EM | Admit: 2022-12-17 | Discharge: 2022-12-17 | Disposition: A | Discharge status: Home / Self Care | Payer: Medicaid Other | Attending: Family Medicine | Admitting: Family Medicine

## 2022-12-17 DIAGNOSIS — M25562 Pain in left knee: Secondary | ICD-10-CM

## 2022-12-17 DIAGNOSIS — M25462 Effusion, left knee: Secondary | ICD-10-CM

## 2022-12-17 DIAGNOSIS — W19XXXA Unspecified fall, initial encounter: Secondary | ICD-10-CM | POA: Diagnosis not present

## 2022-12-17 DIAGNOSIS — K146 Glossodynia: Secondary | ICD-10-CM | POA: Diagnosis not present

## 2022-12-17 MED ORDER — KETOROLAC TROMETHAMINE 30 MG/ML IJ SOLN
INTRAMUSCULAR | Status: AC
  Filled 2022-12-17: qty 1

## 2022-12-17 MED ORDER — KETOROLAC TROMETHAMINE 30 MG/ML IJ SOLN
30.0000 mg | Freq: Once | INTRAMUSCULAR | Status: AC
  Administered 2022-12-17: 30 mg via INTRAMUSCULAR

## 2022-12-17 NOTE — Discharge Instructions (Addendum)
You were seen today for knee pain, and possible glass in the tongue.   You need to see the Ear Nose and Throat specialist for the tongue issue.  Please call Dr. Jearld Fenton at [phone] for an appointment.   Your knee xray did not show an xray, but did show a moderate effusion (fluid).  As discussed we do not drain those here today.   You have several options.  You may make an appointment with an orthopedist by calling Dr. Eulah Pont at [phone].  You may also try to walk in at [facility] at [address], [address], phone [phone].
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
## 2023-01-16 ENCOUNTER — Emergency Department (HOSPITAL_COMMUNITY)
Admission: EM | Admit: 2023-01-16 | Discharge: 2023-01-16 | Disposition: A | Discharge status: Home / Self Care | Payer: Medicaid Other | Attending: Emergency Medicine | Admitting: Emergency Medicine

## 2023-01-16 ENCOUNTER — Emergency Department (HOSPITAL_COMMUNITY): Payer: Medicaid Other

## 2023-01-16 ENCOUNTER — Other Ambulatory Visit: Payer: Self-pay

## 2023-01-16 DIAGNOSIS — D649 Anemia, unspecified: Secondary | ICD-10-CM | POA: Diagnosis not present

## 2023-01-16 DIAGNOSIS — K625 Hemorrhage of anus and rectum: Secondary | ICD-10-CM | POA: Diagnosis not present

## 2023-01-16 DIAGNOSIS — K8020 Calculus of gallbladder without cholecystitis without obstruction: Secondary | ICD-10-CM | POA: Diagnosis not present

## 2023-01-16 DIAGNOSIS — R112 Nausea with vomiting, unspecified: Secondary | ICD-10-CM | POA: Diagnosis not present

## 2023-01-16 DIAGNOSIS — R58 Hemorrhage, not elsewhere classified: Secondary | ICD-10-CM | POA: Diagnosis not present

## 2023-01-16 DIAGNOSIS — R42 Dizziness and giddiness: Secondary | ICD-10-CM | POA: Diagnosis not present

## 2023-01-16 DIAGNOSIS — R109 Unspecified abdominal pain: Secondary | ICD-10-CM | POA: Diagnosis not present

## 2023-01-16 DIAGNOSIS — R932 Abnormal findings on diagnostic imaging of liver and biliary tract: Secondary | ICD-10-CM

## 2023-01-16 DIAGNOSIS — R1010 Upper abdominal pain, unspecified: Secondary | ICD-10-CM

## 2023-01-16 DIAGNOSIS — I10 Essential (primary) hypertension: Secondary | ICD-10-CM | POA: Diagnosis not present

## 2023-01-16 DIAGNOSIS — R1111 Vomiting without nausea: Secondary | ICD-10-CM | POA: Diagnosis not present

## 2023-01-16 DIAGNOSIS — D509 Iron deficiency anemia, unspecified: Secondary | ICD-10-CM

## 2023-01-16 DIAGNOSIS — K828 Other specified diseases of gallbladder: Secondary | ICD-10-CM | POA: Diagnosis not present

## 2023-01-16 DIAGNOSIS — K838 Other specified diseases of biliary tract: Secondary | ICD-10-CM

## 2023-01-16 DIAGNOSIS — I700 Atherosclerosis of aorta: Secondary | ICD-10-CM | POA: Diagnosis not present

## 2023-01-16 LAB — URINALYSIS, W/ REFLEX TO CULTURE (INFECTION SUSPECTED)
Bacteria, UA: NONE SEEN
Bilirubin Urine: NEGATIVE
Glucose, UA: NEGATIVE mg/dL
Ketones, ur: NEGATIVE mg/dL
Leukocytes,Ua: NEGATIVE
Nitrite: POSITIVE — AB
Protein, ur: NEGATIVE mg/dL
Specific Gravity, Urine: 1.025 (ref 1.005–1.030)
pH: 7 (ref 5.0–8.0)

## 2023-01-16 LAB — COMPREHENSIVE METABOLIC PANEL
ALT: 16 U/L (ref 0–44)
AST: 18 U/L (ref 15–41)
Albumin: 3.5 g/dL (ref 3.5–5.0)
Alkaline Phosphatase: 44 U/L (ref 38–126)
Anion gap: 8 (ref 5–15)
BUN: 8 mg/dL (ref 6–20)
CO2: 24 mmol/L (ref 22–32)
Calcium: 9 mg/dL (ref 8.9–10.3)
Chloride: 105 mmol/L (ref 98–111)
Creatinine, Ser: 0.86 mg/dL (ref 0.44–1.00)
GFR, Estimated: >60 mL/min (ref >60)
Glucose, Bld: 98 mg/dL (ref 70–99)
Potassium: 3.8 mmol/L (ref 3.5–5.1)
Sodium: 137 mmol/L (ref 135–145)
Total Bilirubin: 0.6 mg/dL (ref 0.3–1.2)
Total Protein: 6.7 g/dL (ref 6.5–8.1)

## 2023-01-16 LAB — CBC WITH DIFFERENTIAL/PLATELET
Abs Immature Granulocytes: 0.01 10*3/uL (ref 0.00–0.07)
Basophils Absolute: 0 10*3/uL (ref 0.0–0.1)
Basophils Relative: 1 %
Eosinophils Absolute: 0.1 10*3/uL (ref 0.0–0.5)
Eosinophils Relative: 2 %
HCT: 31 % — ABNORMAL LOW (ref 36.0–46.0)
Hemoglobin: 8.9 g/dL — ABNORMAL LOW (ref 12.0–15.0)
Immature Granulocytes: 0 %
Lymphocytes Relative: 25 %
Lymphs Abs: 1 10*3/uL (ref 0.7–4.0)
MCH: 22 pg — ABNORMAL LOW (ref 26.0–34.0)
MCHC: 28.7 g/dL — ABNORMAL LOW (ref 30.0–36.0)
MCV: 76.5 fL — ABNORMAL LOW (ref 80.0–100.0)
Monocytes Absolute: 0.3 10*3/uL (ref 0.1–1.0)
Monocytes Relative: 8 %
Neutro Abs: 2.5 10*3/uL (ref 1.7–7.7)
Neutrophils Relative %: 64 %
Platelets: 501 10*3/uL — ABNORMAL HIGH (ref 150–400)
RBC: 4.05 MIL/uL (ref 3.87–5.11)
RDW: 18.9 % — ABNORMAL HIGH (ref 11.5–15.5)
WBC: 3.9 10*3/uL — ABNORMAL LOW (ref 4.0–10.5)
nRBC: 0 % (ref 0.0–0.2)

## 2023-01-16 LAB — RAPID URINE DRUG SCREEN, HOSP PERFORMED
Amphetamines: NOT DETECTED
Barbiturates: NOT DETECTED
Benzodiazepines: POSITIVE — AB
Cocaine: POSITIVE — AB
Opiates: NOT DETECTED
Tetrahydrocannabinol: POSITIVE — AB

## 2023-01-16 LAB — I-STAT BETA HCG BLOOD, ED (MC, WL, AP ONLY): I-stat hCG, quantitative: <5 m[IU]/mL (ref <5)

## 2023-01-16 LAB — LIPASE, BLOOD: Lipase: 24 U/L (ref 11–51)

## 2023-01-16 MED ORDER — SODIUM CHLORIDE 0.9 % IV SOLN: INTRAVENOUS | Status: DC

## 2023-01-16 MED ORDER — IOHEXOL 350 MG/ML SOLN
65.0000 mL | Freq: Once | INTRAVENOUS | Status: AC | PRN
  Administered 2023-01-16: 65 mL via INTRAVENOUS

## 2023-01-16 MED ORDER — ONDANSETRON HCL 4 MG/2ML IJ SOLN
4.0000 mg | Freq: Once | INTRAMUSCULAR | Status: AC
  Administered 2023-01-16: 4 mg via INTRAVENOUS
  Filled 2023-01-16: qty 2

## 2023-01-16 MED ORDER — FAMOTIDINE IN NACL 20-0.9 MG/50ML-% IV SOLN
20.0000 mg | Freq: Once | INTRAVENOUS | Status: AC
  Administered 2023-01-16: 20 mg via INTRAVENOUS
  Filled 2023-01-16: qty 50

## 2023-01-16 MED ORDER — SODIUM CHLORIDE 0.9 % IV BOLUS
1000.0000 mL | Freq: Once | INTRAVENOUS | Status: AC
  Administered 2023-01-16: 1000 mL via INTRAVENOUS

## 2023-01-16 NOTE — ED Notes (Signed)
Pt sent to  restroom without getting urine sample
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
## 2023-01-16 NOTE — ED Notes (Signed)
Pt is asking for food but still has MRCP order pending. Pt became upset when RN told her she has to wait for procedure first as [place] result shows gallstones. MD came and talked to pt. Pt states she is leaving if she can't have any food. MD agreed to give pt crackers and a drink at this time. Currently denies nausea. Will continue to monitor.

## 2023-01-16 NOTE — ED Provider Notes (Signed)
Patient signed out to me by last provider pending result of MRI of abdomen which did not show any new findings concerning for acute cholecystitis or need for emergent ERCP.  Will give referral to general surgery   Lorre Nick, MD 01/16/23 2257

## 2023-01-16 NOTE — ED Provider Notes (Signed)
[facility] EMERGENCY DEPARTMENT AT [facility] Provider Note   CSN: [id number] Arrival date & time: 01/16/23  1021     History  Chief Complaint  Patient presents with   Abdominal Pain    Nicole Blevins is a 52 y.o. female.  HPI Patient denies any past medical history.  She reports she is healthy.  She developed nausea and vomiting 2 days ago.  She reports she has vomited about 3 times.  She reports has been constantly nauseated and has upper abdominal pain that is burning and cramping in nature.  She reports she did not feel "well" for several days preceding this but did not have specific symptoms.  She reports she has had a subjective fever and some chills.  Significant cough or chest pain.  No pain burning urgency with urination no vaginal discharge or bleeding.  No lower extremity swelling.  Patient reports that she smokes a pack of cigarettes about every 3 days.  She denies any alcohol use.  She reports marijuana use but no other drugs of abuse.    Home Medications Prior to Admission medications   Not on File      Allergies    Patient has no known allergies.    Review of Systems   Review of Systems  Physical Exam Updated Vital Signs BP (!) 170/104 (BP Location: Left Arm)   Pulse 75   Temp 98.3 F (36.8 C) (Oral)   Resp 18   LMP 12/14/2022 (Approximate)   SpO2 100%  Physical Exam Constitutional:      Comments: Alert nontoxic.  No respiratory distress.  Mildly uncomfortable in appearance.  Mental status clear.  HENT:     Mouth/Throat:     Pharynx: Oropharynx is clear.  Eyes:     Extraocular Movements: Extraocular movements intact.  Cardiovascular:     Rate and Rhythm: Normal rate and regular rhythm.  Pulmonary:     Effort: Pulmonary effort is normal.     Breath sounds: Normal breath sounds.  Abdominal:     Comments: Abdomen soft.  Patient Dors is moderate to severe pain in the upper abdomen diffusely.  Lower abdomen nontender.  Musculoskeletal:         General: No swelling or tenderness. Normal range of motion.     Right lower leg: No edema.     Left lower leg: No edema.  Skin:    General: Skin is warm and dry.  Neurological:     General: No focal deficit present.     Mental Status: She is oriented to person, place, and time.     Motor: No weakness.     Coordination: Coordination normal.  Psychiatric:        Mood and Affect: Mood normal.     ED Results / Procedures / Treatments   Labs (all labs ordered are listed, but only abnormal results are displayed) Labs Reviewed  COMPREHENSIVE METABOLIC PANEL  LIPASE, BLOOD  CBC WITH DIFFERENTIAL/PLATELET  URINALYSIS, W/ REFLEX TO CULTURE (INFECTION SUSPECTED)  RAPID URINE DRUG SCREEN, HOSP PERFORMED  I-STAT BETA HCG BLOOD, ED ([facility], [facility], [facility] ONLY)    EKG None  Radiology No results found.  Procedures Procedures    Medications Ordered in ED Medications  sodium chloride 0.9 % bolus 1,000 mL (has no administration in time range)  ondansetron (ZOFRAN) injection 4 mg (has no administration in time range)  0.9 %  sodium chloride infusion (has no administration in time range)  famotidine (PEPCID) IVPB 20 mg  premix (has no administration in time range)    ED Course/ Medical Decision Making/ A&P                             Medical Decision Making Amount and/or Complexity of Data Reviewed Labs: ordered. Radiology: ordered.  Risk Prescription drug management.   Patient denies any significant past medical history.  She does smoke marijuana.  She has had several days of malaise and subjective fever with vomiting.  Upper abdomen is tender.  Lower abdomen nontender.  Differential diagnosis includes viral enteritis or foodborne illness\pancreatitis\gastritis or peptic ulcer disease\cannabinol hyperemesis\bowel obstruction.  Will proceed with diagnostic evaluation and start treatment with fluid resuscitation with normal saline and Pepcid and Zofran for upper abdominal pain and  nausea.  CT abdomen pelvis shows dilated bile duct with a large solitary gallstone.  Consult: [facility] consulted.  Recommend MRCP.  Consult done.  Patient is clinically stable at this time.  Will proceed with MRCP.  Disposition pending results and need for inpatient treatment versus outpatient follow-up with GI.  Patient also has had anemia which will require further evaluation.  Patient does report history of heavy menstrual cycles but also require further diagnostic evaluation and screening colonoscopy.  Clinically stable without any signs of symptomatic anemia.        Final Clinical Impression(s) / ED Diagnoses Final diagnoses:  Calculus of gallbladder without cholecystitis without obstruction    Rx / DC Orders ED Discharge Orders     None         Arby Barrette, MD 01/20/23 1857

## 2023-01-16 NOTE — Discharge Instructions (Addendum)
Return here for fever, vomiting, persistent pain
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
## 2023-01-16 NOTE — ED Notes (Signed)
Patient taken to MRI at this time 

## 2023-01-16 NOTE — Consult Note (Signed)
Consultation  Referring Provider:   Dr Donnald Garre ER Primary Care Physician:  Patient, No Pcp Per Primary Gastroenterologist:  Gentry Fitz       Reason for Consultation: Abdominal pain, abnormal CT abdomen pelvis with CBD dilation, acute on chronic anemia.             HPI:   Nicole Blevins is a 52 y.o. female with past medical history significant for anemia, tobacco use, marijuana use presents to the ER with abdominal pain.  CT abdomen pelvis with contrast in the ER for abdominal pain shows large solitary gallstone, CBD 8 mm without definite obstructing calculus, liver unremarkable.  Normal pancreas normal spleen stomach within normal limits.  No evidence of bowel wall thickening.  6 consider MRCP for further evaluation Reassuringly patient has normal AST at 18, ALT at 16, alk phos 44, total bilirubin 0.6 Potassium 3.8, BUN 8, creatinine 0.86 Patient has no leukocytosis, WBC 3.9 Patient has microcytic anemia with Hgb 8.9, was last normal 06/2019.   Has been slowly decreasing since that time. 2021 was at 11, 2022 9.9 in January 2024 8.3. Thrombocytosis platelets 500 Lipase negative  GI consulted for anemia and abdominal pain, ER physician also concern about follow-up.  Patient was referred to GI 2022 and did not follow-up. No family in the room at time of examination.  Patient states she has had intermittent abdominal pain once every several months.  Can occasionally be worse with fatty foods.  Normally not associate with nausea and vomiting. The last 2 to 3 days patient's had worsening epigastric/right upper quadrant pain, no radiation to her back, associated nausea and vomiting.   No subjective fevers and chills.. Patient denies reflux, dysphagia.  No family history of gallbladder issues.  Looking back for the chart patient's had multiple ER visits over the years for abdominal pain, nausea vomiting and rectal bleeding.   Was referred to GI 2022 for rectal bleeding but did not  follow-up. Patient states last bowel movement was this morning, loose stools.   Denies any recent hematochezia or melena.  Patient denies family history of colon cancer. Patient does smoke marijuana every other day, smokes cigarette. Denies alcohol use or NSAID use. She also still has menstrual period, just finished.  Last about 5 days, has decreased in menorrhagia over the last several years but still is moderately heavy.  Abnormal ED labs: Abnormal Labs Reviewed  CBC WITH DIFFERENTIAL/PLATELET - Abnormal; Notable for the following components:      Result Value   WBC 3.9 (*)    Hemoglobin 8.9 (*)    HCT 31.0 (*)    MCV 76.5 (*)    MCH 22.0 (*)    MCHC 28.7 (*)    RDW 18.9 (*)    Platelets 501 (*)    All other components within normal limits     Past Medical History:  Diagnosis Date   Seizures (HCC)     Surgical History:  She  has no past surgical history on file. Family History:  Her family history includes Aortic aneurysm in her father; Diabetes in her mother; Hypertension in her father and mother; Stroke in her father. Social History:   reports that she has been smoking cigarettes. She has been smoking an average of .5 packs per day. She has never used smokeless tobacco. She reports that she does not currently use alcohol. She reports current drug use. Drug: Marijuana.  Prior to Admission medications   Not on File  Current Facility-Administered Medications  Medication Dose Route Frequency Provider Last Rate Last Admin   0.9 %  sodium chloride infusion   Intravenous Continuous Arby Barrette, MD 150 mL/hr at 01/16/23 1100 New Bag at 01/16/23 1100   No current outpatient medications on file.    Allergies as of 01/16/2023   (No Known Allergies)    Review of Systems:    Constitutional: No weight loss, fever, chills, weakness or fatigue HEENT: Eyes: No change in vision               Ears, Nose, Throat:  No change in hearing or congestion Skin: No rash or  itching Cardiovascular: No chest pain, chest pressure or palpitations   Respiratory: No SOB or cough Gastrointestinal: See HPI and otherwise negative Genitourinary: No dysuria or change in urinary frequency Neurological: No headache, dizziness or syncope Musculoskeletal: No new muscle or joint pain Hematologic: No bleeding or bruising Psychiatric: No history of depression or anxiety     Physical Exam:  Vital signs in last 24 hours: Temp:  [98.3 F (36.8 C)] 98.3 F (36.8 C) (05/09 1421) Pulse Rate:  [69-75] 71 (05/09 1421) Resp:  [16-18] 18 (05/09 1421) BP: (158-193)/(90-115) 180/90 (05/09 1421) SpO2:  [100 %] 100 % (05/09 1421)   Last BM recorded by nurses in past 5 days No data recorded  General:   Pleasant, well developed female in no acute distress Head:  Normocephalic and atraumatic.  Poor dentition Eyes: sclerae anicteric,conjunctive pink  Heart:  regular rate and rhythm Pulm: Clear anteriorly; no wheezing Abdomen:  Soft, Non-distended AB, Active bowel sounds. moderate tenderness in the RUQ. Neg murphysWithout guarding and Without rebound, No organomegaly appreciated. Extremities:  Without edema. Msk:  Symmetrical without gross deformities. Peripheral pulses intact.  Neurologic:  Alert and  oriented x4;  No focal deficits.  Skin:   Dry and intact without significant lesions or rashes. Psychiatric:  Cooperative. Normal mood and affect.  LAB RESULTS: Recent Labs    01/16/23 1037  WBC 3.9*  HGB 8.9*  HCT 31.0*  PLT 501*   BMET Recent Labs    01/16/23 1037  NA 137  K 3.8  CL 105  CO2 24  GLUCOSE 98  BUN 8  CREATININE 0.86  CALCIUM 9.0   LFT Recent Labs    01/16/23 1037  PROT 6.7  ALBUMIN 3.5  AST 18  ALT 16  ALKPHOS 44  BILITOT 0.6   PT/INR No results for input(s): "LABPROT", "INR" in the last 72 hours.  STUDIES: CT ABDOMEN PELVIS W CONTRAST  Result Date: 01/16/2023 CLINICAL DATA:  Acute generalized abdominal pain. EXAM: CT ABDOMEN AND PELVIS  WITH CONTRAST TECHNIQUE: Multidetector CT imaging of the abdomen and pelvis was performed using the standard protocol following bolus administration of intravenous contrast. RADIATION DOSE REDUCTION: This exam was performed according to the departmental dose-optimization program which includes automated exposure control, adjustment of the mA and/or kV according to patient size and/or use of iterative reconstruction technique. CONTRAST:  65mL OMNIPAQUE IOHEXOL 350 MG/ML SOLN COMPARISON:  65 mL Omnipaque 350 intravenously. FINDINGS: Lower chest: No acute abnormality. Hepatobiliary: Large solitary gallstone is noted. Common bile duct is mildly dilated at 8 mm without definite evidence of obstructing calculus. Liver is unremarkable. Pancreas: Unremarkable. No pancreatic ductal dilatation or surrounding inflammatory changes. Spleen: Normal in size without focal abnormality. Adrenals/Urinary Tract: Adrenal glands are unremarkable. Kidneys are normal, without renal calculi, focal lesion, or hydronephrosis. Bladder is unremarkable. Stomach/Bowel: Stomach is within normal limits. Appendix  appears normal. No evidence of bowel wall thickening, distention, or inflammatory changes. Vascular/Lymphatic: Aortic atherosclerosis. No enlarged abdominal or pelvic lymph nodes. Reproductive: 5 x 4 cm uterine fibroid is noted. No adnexal abnormality is noted. Other: No abdominal wall hernia or abnormality. No abdominopelvic ascites. Musculoskeletal: No acute or significant osseous findings. IMPRESSION: Large solitary gallstone without inflammation. Mildly dilated common bile duct is noted at 8 mm without definite evidence of obstructing calculus. Correlation with liver function tests is recommended to evaluate for biliary obstruction. MRCP may be performed for further evaluation. 5 cm uterine fibroid. Aortic Atherosclerosis (ICD10-I70.0). Electronically Signed   By: Lupita Raider M.D.   On: 01/16/2023 13:05      Impression     Abdominal pain intermittent for several years now associated with nausea and vomiting last 3 days CT abdomen pelvis with contrast shows large solitary gallstone without inflammation Unremarkable LFTs.AST at 18, ALT at 16, alk phos 44, total bilirubin 0.6  No associated leukocytosis, afebrile Patient does have some thrombocytosis possibly acute phase reactant. Patient also  smokes marijuana chronically. Need to rule out cholelithiasis, acute cholecystitis, choledocholithiasis though with normal LFTs this is reassuring.  Possibly also related to cannabinoid hyperemesis syndrome.  Microcytic anemia History of rectal bleeding, abdominal pain CT abdomen pelvis without bowel inflammation/thickening Negative FOBT 2022 Still has menstrual cycle.   Gradually decreasing hemoglobin since 2021, 2021 Hgb 11, 2022 Hgb 9.9, currently 8.9 with MCV of 76.5.   Recent Labs    09/15/22 0155 01/16/23 1037  HGB 8.3* 8.9*    Thrombocytosis  Platelets 501 Possible reactive, some mild associated leukopenia and anemia.  Active Problems:   * No active hospital problems. *    LOS: 0 days     Plan   -Will get MRCP to evaluate CBD ducts, rule out choledocholithiasis reassuring LFTs. -Continue monitor CBC, c-Met. -Discussed cannabis hyperemesis syndrome with patient, marijuana cessation discussed. -Supportive care with antinausea, pain medications -Will add on iron and ferritin for possible iron deficiency anemia, this could be driven from menstrual period but with history of rectal bleeding and age 75, patient is due for colonoscopy.  No active GI bleeding at this time. -Will arrange for follow-up with her office outpatient for colonoscopy plus or minus endoscopy if anemia. -Patient does not have iron deficiency anemia with progressive anemia, leukopenia and thrombocytosis manage consider hematology evaluation. -After MRCP may advance diet.  Thank you for your kind consultation, we will continue to  follow.   Doree Albee  01/16/2023, 2:23 PM

## 2023-01-20 ENCOUNTER — Encounter: Payer: Self-pay | Admitting: Physician Assistant

## 2023-04-01 NOTE — Progress Notes (Deleted)
04/01/2023 Nicole Blevins [id number] December 31, 1970  Referring provider: No ref. provider found Primary GI doctor: Dr. Rhea Belton  ASSESSMENT AND PLAN:   There are no diagnoses linked to this encounter.   Patient Care Team: Patient, No Pcp Per as PCP - General (General Practice)  HISTORY OF PRESENT ILLNESS: 52 y.o. female with a past medical history of anemia, tobacco use, marijuana use, menorrhagia, microcytic anemia and others listed below presents for evaluation of ***.   CT scan shows large solitary gallstone.  Mildly dilated common bile duct at 8 mm without definitive CBD stone. Right upper quadrant ultrasound shows cholelithiasis with positive Murphy sign Subsequent MRCP showed cholelithiasis with mild gallbladder wall thickening no significant pericholecystic fluid, CBD increased 8 mm no signs of choledocholithiasis At time of ER hemoglobin 8.9, MCV 76.5 with decrease since 2021 slowly from 11.  Associated WBC 3.9 and platelets 501.  Was post to get iron/ferritin but do not see where this was done.  Patient states she has had intermittent abdominal pain once every several months.  Can occasionally be worse with fatty foods.  Normally not associate with nausea and vomiting. The last 2 to 3 days patient's had worsening epigastric/right upper quadrant pain, no radiation to her back, associated nausea and vomiting.   No subjective fevers and chills.. Patient denies reflux, dysphagia.  No family history of gallbladder issues.   Patient denies family history of colon cancer. Patient does smoke marijuana every other day, smokes cigarette. Denies alcohol use or NSAID use. She also still has menstrual period, just finished.  Last about 5 days, has decreased in menorrhagia over the last several years but still is moderately heavy.  She  reports that she has been smoking cigarettes. She has never used smokeless tobacco. She reports that she does not currently use alcohol. She reports  current drug use. Drug: Marijuana.  RELEVANT LABS AND IMAGING: CBC    Component Value Date/Time   WBC 3.9 (L) 01/16/2023 1037   RBC 4.05 01/16/2023 1037   HGB 8.9 (L) 01/16/2023 1037   HCT 31.0 (L) 01/16/2023 1037   PLT 501 (H) 01/16/2023 1037   MCV 76.5 (L) 01/16/2023 1037   MCH 22.0 (L) 01/16/2023 1037   MCHC 28.7 (L) 01/16/2023 1037   RDW 18.9 (H) 01/16/2023 1037   LYMPHSABS 1.0 01/16/2023 1037   MONOABS 0.3 01/16/2023 1037   EOSABS 0.1 01/16/2023 1037   BASOSABS 0.0 01/16/2023 1037   Recent Labs    09/15/22 0155 01/16/23 1037  HGB 8.3* 8.9*    CMP     Component Value Date/Time   NA 137 01/16/2023 1037   K 3.8 01/16/2023 1037   CL 105 01/16/2023 1037   CO2 24 01/16/2023 1037   GLUCOSE 98 01/16/2023 1037   BUN 8 01/16/2023 1037   CREATININE 0.86 01/16/2023 1037   CALCIUM 9.0 01/16/2023 1037   PROT 6.7 01/16/2023 1037   ALBUMIN 3.5 01/16/2023 1037   AST 18 01/16/2023 1037   ALT 16 01/16/2023 1037   ALKPHOS 44 01/16/2023 1037   BILITOT 0.6 01/16/2023 1037   GFRNONAA >60 01/16/2023 1037   GFRAA >60 04/15/2020 0746      Latest Ref Rng & Units 01/16/2023   10:37 AM 09/15/2022    1:55 AM 06/25/2021    8:27 AM  Hepatic Function  Total Protein 6.5 - 8.1 g/dL 6.7  6.8  6.4   Albumin 3.5 - 5.0 g/dL 3.5  3.6  3.3   AST 15 -  41 U/L 18  24  18    ALT 0 - 44 U/L 16  17  14    Alk Phosphatase 38 - 126 U/L 44  46  36   Total Bilirubin 0.3 - 1.2 mg/dL 0.6  0.6  0.2       Current Medications:  No current outpatient medications on file.  Medical History:  Past Medical History:  Diagnosis Date   Seizures (HCC)    Allergies: No Known Allergies   Surgical History:  She  has no past surgical history on file. Family History:  Her family history includes Aortic aneurysm in her father; Diabetes in her mother; Hypertension in her father and mother; Stroke in her father.  REVIEW OF SYSTEMS  : All other systems reviewed and negative except where noted in the History of  Present Illness.  PHYSICAL EXAM: There were no vitals taken for this visit. General Appearance: Well nourished, in no apparent distress. Head:   Normocephalic and atraumatic. Eyes:  sclerae anicteric,conjunctive pink  Respiratory: Respiratory effort normal, BS equal bilaterally without rales, rhonchi, wheezing. Cardio: RRR with no MRGs. Peripheral pulses intact.  Abdomen: Soft,  {BlankSingle:19197::"Flat","Obese","Non-distended"} ,active bowel sounds. {actendernessAB:27319} tenderness {anatomy; site abdomen:5010}. {BlankMultiple:19196::"Without guarding","With guarding","Without rebound","With rebound"}. No masses. Rectal: {acrectalexam:27461} Musculoskeletal: Full ROM, {PSY - GAIT AND STATION:22860} gait. {With/Without:[id number]} edema. Skin:  Dry and intact without significant lesions or rashes Neuro: Alert and  oriented x4;  No focal deficits. Psych:  Cooperative. Normal mood and affect.    Doree Albee, PA-C 1:43 PM

## 2023-04-02 ENCOUNTER — Ambulatory Visit: Payer: Medicaid Other | Admitting: Physician Assistant

## 2023-12-18 ENCOUNTER — Emergency Department (HOSPITAL_COMMUNITY)

## 2023-12-18 ENCOUNTER — Other Ambulatory Visit: Payer: Self-pay

## 2023-12-18 ENCOUNTER — Observation Stay (HOSPITAL_COMMUNITY)
Admission: EM | Admit: 2023-12-18 | Discharge: 2023-12-20 | Disposition: A | Discharge status: Home / Self Care | Attending: General Surgery | Admitting: General Surgery

## 2023-12-18 DIAGNOSIS — F17210 Nicotine dependence, cigarettes, uncomplicated: Secondary | ICD-10-CM | POA: Insufficient documentation

## 2023-12-18 DIAGNOSIS — K8012 Calculus of gallbladder with acute and chronic cholecystitis without obstruction: Principal | ICD-10-CM | POA: Insufficient documentation

## 2023-12-18 DIAGNOSIS — K8020 Calculus of gallbladder without cholecystitis without obstruction: Secondary | ICD-10-CM | POA: Diagnosis not present

## 2023-12-18 DIAGNOSIS — R1010 Upper abdominal pain, unspecified: Secondary | ICD-10-CM

## 2023-12-18 DIAGNOSIS — R109 Unspecified abdominal pain: Secondary | ICD-10-CM | POA: Diagnosis present

## 2023-12-18 LAB — CBC WITH DIFFERENTIAL/PLATELET
Abs Immature Granulocytes: 0.02 10*3/uL (ref 0.00–0.07)
Basophils Absolute: 0 10*3/uL (ref 0.0–0.1)
Basophils Relative: 0 %
Eosinophils Absolute: 0.1 10*3/uL (ref 0.0–0.5)
Eosinophils Relative: 2 %
HCT: 42.3 % (ref 36.0–46.0)
Hemoglobin: 14.2 g/dL (ref 12.0–15.0)
Immature Granulocytes: 0 %
Lymphocytes Relative: 21 %
Lymphs Abs: 1.4 10*3/uL (ref 0.7–4.0)
MCH: 32.9 pg (ref 26.0–34.0)
MCHC: 33.6 g/dL (ref 30.0–36.0)
MCV: 97.9 fL (ref 80.0–100.0)
Monocytes Absolute: 0.5 10*3/uL (ref 0.1–1.0)
Monocytes Relative: 7 %
Neutro Abs: 4.7 10*3/uL (ref 1.7–7.7)
Neutrophils Relative %: 70 %
Platelets: 316 10*3/uL (ref 150–400)
RBC: 4.32 MIL/uL (ref 3.87–5.11)
RDW: 12.5 % (ref 11.5–15.5)
WBC: 6.8 10*3/uL (ref 4.0–10.5)
nRBC: 0 % (ref 0.0–0.2)

## 2023-12-18 LAB — COMPREHENSIVE METABOLIC PANEL WITH GFR
ALT: 16 U/L (ref 0–44)
AST: 20 U/L (ref 15–41)
Albumin: 4 g/dL (ref 3.5–5.0)
Alkaline Phosphatase: 38 U/L (ref 38–126)
Anion gap: 12 (ref 5–15)
BUN: 5 mg/dL — ABNORMAL LOW (ref 6–20)
CO2: 22 mmol/L (ref 22–32)
Calcium: 9.5 mg/dL (ref 8.9–10.3)
Chloride: 101 mmol/L (ref 98–111)
Creatinine, Ser: 0.78 mg/dL (ref 0.44–1.00)
GFR, Estimated: >60 mL/min (ref >60)
Glucose, Bld: 96 mg/dL (ref 70–99)
Potassium: 4 mmol/L (ref 3.5–5.1)
Sodium: 135 mmol/L (ref 135–145)
Total Bilirubin: 0.6 mg/dL (ref 0.0–1.2)
Total Protein: 7.5 g/dL (ref 6.5–8.1)

## 2023-12-18 LAB — URINALYSIS, ROUTINE W REFLEX MICROSCOPIC
Bacteria, UA: NONE SEEN
Bilirubin Urine: NEGATIVE
Glucose, UA: NEGATIVE mg/dL
Ketones, ur: NEGATIVE mg/dL
Leukocytes,Ua: NEGATIVE
Nitrite: NEGATIVE
Protein, ur: NEGATIVE mg/dL
Specific Gravity, Urine: 1.008 (ref 1.005–1.030)
pH: 5 (ref 5.0–8.0)

## 2023-12-18 LAB — LIPASE, BLOOD: Lipase: 23 U/L (ref 11–51)

## 2023-12-18 MED ORDER — DIPHENHYDRAMINE HCL 50 MG/ML IJ SOLN
12.5000 mg | Freq: Four times a day (QID) | INTRAMUSCULAR | Status: DC | PRN
  Administered 2023-12-20: 12.5 mg via INTRAVENOUS
  Filled 2023-12-18: qty 1

## 2023-12-18 MED ORDER — ONDANSETRON 4 MG PO TBDP: 4.0000 mg | ORAL_TABLET | Freq: Four times a day (QID) | ORAL | Status: DC | PRN

## 2023-12-18 MED ORDER — SODIUM CHLORIDE 0.9 % IV SOLN
2.0000 g | INTRAVENOUS | Status: DC
  Administered 2023-12-18: 2 g via INTRAVENOUS
  Filled 2023-12-18: qty 20

## 2023-12-18 MED ORDER — SIMETHICONE 80 MG PO CHEW: 40.0000 mg | CHEWABLE_TABLET | Freq: Four times a day (QID) | ORAL | Status: DC | PRN

## 2023-12-18 MED ORDER — MORPHINE SULFATE (PF) 2 MG/ML IV SOLN: 1.0000 mg | INTRAVENOUS | Status: DC | PRN

## 2023-12-18 MED ORDER — ACETAMINOPHEN 500 MG PO TABS
1000.0000 mg | ORAL_TABLET | Freq: Four times a day (QID) | ORAL | Status: DC
  Administered 2023-12-18 – 2023-12-20 (×6): 1000 mg via ORAL
  Filled 2023-12-18 (×6): qty 2

## 2023-12-18 MED ORDER — ONDANSETRON HCL 4 MG/2ML IJ SOLN: 4.0000 mg | Freq: Four times a day (QID) | INTRAMUSCULAR | Status: DC | PRN

## 2023-12-18 MED ORDER — OXYCODONE-ACETAMINOPHEN 5-325 MG PO TABS
1.0000 | ORAL_TABLET | Freq: Once | ORAL | Status: AC
  Administered 2023-12-18: 1 via ORAL
  Filled 2023-12-18: qty 1

## 2023-12-18 MED ORDER — OXYCODONE HCL 5 MG PO TABS: 5.0000 mg | ORAL_TABLET | ORAL | Status: DC | PRN

## 2023-12-18 MED ORDER — KCL IN DEXTROSE-NACL 20-5-0.45 MEQ/L-%-% IV SOLN
INTRAVENOUS | Status: AC
  Filled 2023-12-18: qty 1000

## 2023-12-18 MED ORDER — ENOXAPARIN SODIUM 40 MG/0.4ML IJ SOSY: 40.0000 mg | PREFILLED_SYRINGE | INTRAMUSCULAR | Status: DC

## 2023-12-18 MED ORDER — DIPHENHYDRAMINE HCL 12.5 MG/5ML PO ELIX: 12.5000 mg | ORAL_SOLUTION | Freq: Four times a day (QID) | ORAL | Status: DC | PRN

## 2023-12-18 MED ORDER — MELATONIN 3 MG PO TABS: 3.0000 mg | ORAL_TABLET | Freq: Every evening | ORAL | Status: DC | PRN

## 2023-12-18 MED ORDER — HYDRALAZINE HCL 20 MG/ML IJ SOLN: 10.0000 mg | INTRAMUSCULAR | Status: DC | PRN
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
## 2023-12-18 NOTE — ED Provider Notes (Signed)
 [place] EMERGENCY DEPARTMENT AT [facility] Provider Note   CSN: [id number] Arrival date & time: 12/18/23  1149     History  Chief Complaint  Patient presents with   Abdominal Pain   Chest Pain   Emesis   Nausea   Dysuria   Polyuria    Nicole Blevins is a 53 y.o. female with no significant past medical history presents due to abdominal pain and dysuria.  Patient states that for the last 3 weeks, she has been having dysuria with peeing up to 20 times a day, no burning or hematuria, but sensation of incomplete bladder emptying.  Denies any fevers.  Also endorses intermittent flank pain.  This morning, she had severe right upper quadrant pain with several episodes of vomiting, and the right upper quadrant pain is intermittent in nature.  Denies any fevers.  States that she has a known history of cholelithiasis.   Abdominal Pain Associated symptoms: chest pain, dysuria and vomiting   Chest Pain Associated symptoms: abdominal pain and vomiting   Emesis Associated symptoms: abdominal pain   Dysuria Associated symptoms: abdominal pain and vomiting        Home Medications Prior to Admission medications   Not on File      Allergies    Patient has no known allergies.    Review of Systems   Review of Systems  Cardiovascular:  Positive for chest pain.  Gastrointestinal:  Positive for abdominal pain and vomiting.  Genitourinary:  Positive for dysuria.    Physical Exam Updated Vital Signs BP (!) 154/93 (BP Location: Left Arm)   Pulse 79   Temp 98.8 F (37.1 C) (Oral)   Resp 16   Ht 5\' 3"  (1.6 m)   Wt 86.2 kg   LMP 11/22/2023   SpO2 96%   BMI 33.66 kg/m  Physical Exam Vitals and nursing note reviewed.  Constitutional:      General: She is not in acute distress.    Appearance: She is well-developed.  HENT:     Head: Normocephalic and atraumatic.  Eyes:     Conjunctiva/sclera: Conjunctivae normal.  Cardiovascular:     Rate and Rhythm: Normal rate  and regular rhythm.     Heart sounds: No murmur heard. Pulmonary:     Effort: Pulmonary effort is normal. No respiratory distress.     Breath sounds: Normal breath sounds.  Abdominal:     General: Abdomen is flat.     Palpations: Abdomen is soft.     Comments: Positive Murphy sign, otherwise no pain to palpation of the left upper or bilateral lower quadrants, no CVA tenderness  Musculoskeletal:        General: No swelling.     Cervical back: Neck supple.  Skin:    General: Skin is warm and dry.     Capillary Refill: Capillary refill takes less than 2 seconds.  Neurological:     Mental Status: She is alert.  Psychiatric:        Mood and Affect: Mood normal.     ED Results / Procedures / Treatments   Labs (all labs ordered are listed, but only abnormal results are displayed) Labs Reviewed  COMPREHENSIVE METABOLIC PANEL WITH GFR - Abnormal; Notable for the following components:      Result Value   BUN 5 (*)    All other components within normal limits  URINALYSIS, ROUTINE W REFLEX MICROSCOPIC - Abnormal; Notable for the following components:   Color, Urine STRAW (*)  Hgb urine dipstick MODERATE (*)    All other components within normal limits  CBC WITH DIFFERENTIAL/PLATELET  LIPASE, BLOOD  COMPREHENSIVE METABOLIC PANEL WITH GFR  HIV ANTIBODY (ROUTINE TESTING W REFLEX)    EKG None  Radiology CT Renal Stone Study Result Date: 12/18/2023 CLINICAL DATA:  Abdominal/flank pain, stone suspected EXAM: CT ABDOMEN AND PELVIS WITHOUT CONTRAST TECHNIQUE: Multidetector CT imaging of the abdomen and pelvis was performed following the standard protocol without IV contrast. Of note, the lack of intravenous contrast limits evaluation of the solid organ parenchyma and vascularity. RADIATION DOSE REDUCTION: This exam was performed according to the departmental dose-optimization program which includes automated exposure control, adjustment of the mA and/or kV according to patient size and/or  use of iterative reconstruction technique. COMPARISON:  Jan 16, 2023 FINDINGS: Lower chest: No focal airspace consolidation or pleural effusion. Hepatobiliary: No mass.Fluid-filled, distended gallbladder with a large gallstone in the gallbladder neck. Mild wall thickening along the hepatic aspect of the gallbladder. No pericholecystic inflammation.Mild intrahepatic biliary ductal dilation. The common bile duct is also dilated measuring 9 mm. No radiopaque choledocholithiasis. Pancreas: No mass or main ductal dilation.No peripancreatic inflammation or fluid collection. Spleen: Normal size. No mass. Adrenals/Urinary Tract: No adrenal masses. No renal mass. No hydronephrosis or nephrolithiasis. The urinary bladder is completely decompressed. Stomach/Bowel: The stomach contains ingested material without focal abnormality. No small bowel wall thickening or inflammation. No small bowel obstruction. Normal appendix. Vascular/Lymphatic: No aortic aneurysm. Diffuse aortoiliac atherosclerosis. No intraabdominal or pelvic lymphadenopathy. Reproductive: Enlarged, bulky uterus containing multiple masses, likely fibroids. The largest mass along the left uterine fundus measures 7 cm.3 cm cyst in the left ovary. 3.1 cm hypodensity along the right hemipelvis, possibly the right ovary containing a cyst or an exophytic, subserosal fibroid. No free pelvic fluid. Other: No pneumoperitoneum, ascites, or mesenteric inflammation. Musculoskeletal: No acute fracture or destructive lesion.Multilevel thoracic osteophytosis. IMPRESSION: 1. Mild intrahepatic biliary ductal dilation with dilation of the common bile duct measuring 9 mm. While no radiopaque choledocholithiasis was visualized, correlation with serum bilirubin recommended. A nonemergent multiphase abdominal MRI with IV contrast should be considered to exclude downstream obstruction. 2. Fluid-filled, distended gallbladder with a large gallstone in the neck. Mild wall thickening along  the hepatic aspect of the gallbladder without pericholecystic inflammation, may be related to biliary stasis or acute hepatitis. Correlation with liver enzymes recommended. If there is concern for acute cholecystitis, a nuclear medicine hepatobiliary scan would be recommended. 3. In the left ovary, there is a 3 cm cyst. 3.1 cm hypodensity along the right hemipelvis, possibly a cyst in the right ovary versus exophytic fibroid. 4. Enlarged, fibroid uterus. Electronically Signed   By: Wallie Char M.D.   On: 12/18/2023 17:52   US Abdomen Limited RUQ (LIVER/GB) Result Date: 12/18/2023 CLINICAL DATA:  151471 RUQ pain 151471 EXAM: ULTRASOUND ABDOMEN LIMITED RIGHT UPPER QUADRANT COMPARISON:  CT scan abdomen and pelvis from 01/16/2023 FINDINGS: Gallbladder: The gallbladder is physiologically distended. There is a 2.6 cm gallstones, which appears stuck in the gallbladder neck region. There is no movement of gallstone on decubitus images. However, the gallbladder is otherwise unremarkable. No abnormal wall thickening. Sonographic Murphy sign was negative. No pericholecystic free fluid. Common bile duct: Diameter: Up to 6.5 mm.  No intrahepatic bile duct dilation. Liver: There is mildly increased hepatic echogenicity which reduces the sensitivity of ultrasound for the detection of focal masses. That being said, no focal mass is identified. Portal vein is patent on color Doppler imaging with normal direction  of blood flow towards the liver. Other: None. IMPRESSION: 1. There is a 2.6 cm gallstone stuck in the gallbladder neck region; however, without sonographic evidence of acute cholecystitis. 2. Mildly increased hepatic echogenicity, a nonspecific finding that is most commonly seen on the basis of steatosis in the absence of known liver disease. Electronically Signed   By: Jules Schick M.D.   On: 12/18/2023 14:10    Procedures Procedures    Medications Ordered in ED Medications  enoxaparin (LOVENOX) injection  40 mg (has no administration in time range)  dextrose 5 % and 0.45 % NaCl with KCl 20 mEq/L infusion ( Intravenous New Bag/Given 12/18/23 2204)  cefTRIAXone (ROCEPHIN) 2 g in sodium chloride 0.9 % 100 mL IVPB (2 g Intravenous New Bag/Given 12/18/23 2207)  acetaminophen (TYLENOL) tablet 1,000 mg (1,000 mg Oral Given 12/18/23 2159)  oxyCODONE (Oxy IR/ROXICODONE) immediate release tablet 5-10 mg (has no administration in time range)  morphine (PF) 2 MG/ML injection 1-2 mg (has no administration in time range)  melatonin tablet 3 mg (has no administration in time range)  diphenhydrAMINE (BENADRYL) 12.5 MG/5ML elixir 12.5 mg (has no administration in time range)    Or  diphenhydrAMINE (BENADRYL) injection 12.5 mg (has no administration in time range)  ondansetron (ZOFRAN-ODT) disintegrating tablet 4 mg (has no administration in time range)    Or  ondansetron (ZOFRAN) injection 4 mg (has no administration in time range)  simethicone (MYLICON) chewable tablet 40 mg (has no administration in time range)  hydrALAZINE (APRESOLINE) injection 10 mg (has no administration in time range)  oxyCODONE-acetaminophen (PERCOCET/ROXICET) 5-325 MG per tablet 1 tablet (1 tablet Oral Given 12/18/23 1600)    ED Course/ Medical Decision Making/ A&P                                 Medical Decision Making Risk Decision regarding hospitalization.   Patient is alert, afebrile, and hemodynamically stable in the ED in no acute distress.  Physical exam as noted above, focal right upper quadrant pain noted with positive Murphy sign.  Differential includes gallbladder pathology, UTI, pyelonephritis, ureterolithiasis, pancreatitis, amongst other pathology.  Workup initially obtained through triage, demonstrated unremarkable CBC and CMP as well as normal lipase.  Urinalysis with no obvious infection.  Right upper quadrant ultrasound was obtained and demonstrated 2.3 cm gallstone at the neck of the gallbladder with no changes  for cholecystitis.  Renal stone study negative but did note 9 mm CBD and dilated gallbladder.  General surgery was consulted, will admit the patient for likely cholecystectomy after speaking with Dr. Andrey Campanile.  Patient was given Percocet in the ED with moderate pain control.  Discussed admission, for which patient is agreeable.  Patient seen in conjunction with Dr. Jeraldine Loots, who agreed with the above work-up and plan of care.        Final Clinical Impression(s) / ED Diagnoses Final diagnoses:  Symptomatic cholelithiasis    Rx / DC Orders ED Discharge Orders     None         Barrett Shell, MD 12/18/23 2258

## 2023-12-18 NOTE — ED Triage Notes (Signed)
 Pt. Stated, I've had chest pain, stomach pain , and a lot of pressure when I pee, Im peeing about 20 times a day.This started about 3 weeks ago

## 2023-12-18 NOTE — ED Provider Triage Note (Signed)
 Emergency Medicine Provider Triage Evaluation Note  Nicole Blevins , a 53 y.o. female  was evaluated in triage.  Pt complains of right upper quadrant abdominal pain and increased urinary frequency.  She does report this has been ongoing for the last several days with notable worsening this morning and states that pain was 15 out of 10 when she woke up.  She is currently an inmate and states that she recently was diagnosed with a gallstone but states that she has not had her gallbladder removed.  No specific correlation with causes of her pain such as eating, drinking, or fasting.  She had 1 episode of vomiting this morning has not resolved.  Denies any recent fever chills as far she is aware.   Review of Systems  Positive: As above Negative: As above  Physical Exam  BP (!) 141/91 (BP Location: Right Arm)   Pulse 82   Temp 98 F (36.7 C)   Resp 18   SpO2 100%  Gen:   Awake, no distress   Resp:  Normal effort  MSK:   Moves extremities without difficulty  Other:  TTP in the RUQ as well as RLQ.  Right CVA tenderness.  Medical Decision Making  Medically screening exam initiated at 12:34 PM.  Appropriate orders placed.  Rillie Riffel was informed that the remainder of the evaluation will be completed by another provider, this initial triage assessment does not replace that evaluation, and the importance of remaining in the ED until their evaluation is complete.  Patient with a mixed presentation with concern for possible cholecystitis versus appendicitis versus urolithiasis.  Will obtain labs and right upper quadrant as well as CT imaging of abdomen pelvis to rule out appendicitis versus other source.   Smitty Knudsen, PA-C 12/18/23 1236

## 2023-12-18 NOTE — ED Notes (Signed)
 Pt complaining of being hungry, no pain at the moment. Resting. Patient care taken.

## 2023-12-19 ENCOUNTER — Observation Stay (HOSPITAL_BASED_OUTPATIENT_CLINIC_OR_DEPARTMENT_OTHER): Admitting: Anesthesiology

## 2023-12-19 ENCOUNTER — Encounter (HOSPITAL_COMMUNITY): Payer: Self-pay

## 2023-12-19 ENCOUNTER — Observation Stay (HOSPITAL_COMMUNITY): Admitting: Anesthesiology

## 2023-12-19 ENCOUNTER — Other Ambulatory Visit: Payer: Self-pay

## 2023-12-19 ENCOUNTER — Encounter (HOSPITAL_COMMUNITY)
Admission: EM | Disposition: A | Discharge status: Home / Self Care | Payer: Self-pay | Source: Home / Self Care | Attending: Emergency Medicine

## 2023-12-19 DIAGNOSIS — K810 Acute cholecystitis: Secondary | ICD-10-CM | POA: Diagnosis not present

## 2023-12-19 LAB — COMPREHENSIVE METABOLIC PANEL WITH GFR
ALT: 14 U/L (ref 0–44)
AST: 14 U/L — ABNORMAL LOW (ref 15–41)
Albumin: 3.3 g/dL — ABNORMAL LOW (ref 3.5–5.0)
Alkaline Phosphatase: 35 U/L — ABNORMAL LOW (ref 38–126)
Anion gap: 10 (ref 5–15)
BUN: 10 mg/dL (ref 6–20)
CO2: 25 mmol/L (ref 22–32)
Calcium: 9 mg/dL (ref 8.9–10.3)
Chloride: 101 mmol/L (ref 98–111)
Creatinine, Ser: 0.91 mg/dL (ref 0.44–1.00)
GFR, Estimated: >60 mL/min (ref >60)
Glucose, Bld: 92 mg/dL (ref 70–99)
Potassium: 3.7 mmol/L (ref 3.5–5.1)
Sodium: 136 mmol/L (ref 135–145)
Total Bilirubin: 0.6 mg/dL (ref 0.0–1.2)
Total Protein: 6.4 g/dL — ABNORMAL LOW (ref 6.5–8.1)

## 2023-12-19 LAB — HIV ANTIBODY (ROUTINE TESTING W REFLEX): HIV Screen 4th Generation wRfx: NONREACTIVE

## 2023-12-19 LAB — HCG, SERUM, QUALITATIVE: Preg, Serum: NEGATIVE

## 2023-12-19 SURGERY — LAPAROSCOPIC CHOLECYSTECTOMY
Anesthesia: General

## 2023-12-19 MED ORDER — POLYETHYLENE GLYCOL 3350 17 G PO PACK: 17.0000 g | PACK | Freq: Every day | ORAL | Status: DC | PRN

## 2023-12-19 MED ORDER — LACTATED RINGERS IV SOLN: INTRAVENOUS | Status: DC

## 2023-12-19 MED ORDER — ONDANSETRON HCL 4 MG/2ML IJ SOLN
INTRAMUSCULAR | Status: DC | PRN
  Administered 2023-12-19: 4 mg via INTRAVENOUS

## 2023-12-19 MED ORDER — ORAL CARE MOUTH RINSE: 15.0000 mL | Freq: Once | OROMUCOSAL | Status: AC

## 2023-12-19 MED ORDER — ROCURONIUM BROMIDE 10 MG/ML (PF) SYRINGE
PREFILLED_SYRINGE | INTRAVENOUS | Status: DC | PRN
  Administered 2023-12-19: 50 mg via INTRAVENOUS

## 2023-12-19 MED ORDER — PROPOFOL 10 MG/ML IV BOLUS
INTRAVENOUS | Status: AC
  Filled 2023-12-19: qty 20

## 2023-12-19 MED ORDER — LIDOCAINE 2% (20 MG/ML) 5 ML SYRINGE
INTRAMUSCULAR | Status: DC | PRN
Start: 2023-12-19 — End: 2023-12-19
  Administered 2023-12-19: 100 mg via INTRAVENOUS

## 2023-12-19 MED ORDER — PROPOFOL 10 MG/ML IV BOLUS
INTRAVENOUS | Status: DC | PRN
  Administered 2023-12-19: 200 mg via INTRAVENOUS

## 2023-12-19 MED ORDER — BUPIVACAINE-EPINEPHRINE 0.25% -1:200000 IJ SOLN
INTRAMUSCULAR | Status: DC | PRN
  Administered 2023-12-19: 24 mL

## 2023-12-19 MED ORDER — FENTANYL CITRATE (PF) 100 MCG/2ML IJ SOLN
INTRAMUSCULAR | Status: AC
  Administered 2023-12-19: 25 ug via INTRAVENOUS
  Filled 2023-12-19: qty 2

## 2023-12-19 MED ORDER — CHLORHEXIDINE GLUCONATE 0.12 % MT SOLN: 15.0000 mL | Freq: Once | OROMUCOSAL | Status: AC

## 2023-12-19 MED ORDER — CHLORHEXIDINE GLUCONATE 0.12 % MT SOLN
OROMUCOSAL | Status: AC
  Administered 2023-12-19: 15 mL via OROMUCOSAL
  Filled 2023-12-19: qty 15

## 2023-12-19 MED ORDER — SODIUM CHLORIDE 0.9 % IR SOLN
Status: DC | PRN
  Administered 2023-12-19: 1000 mL

## 2023-12-19 MED ORDER — FENTANYL CITRATE (PF) 100 MCG/2ML IJ SOLN
25.0000 ug | INTRAMUSCULAR | Status: DC | PRN
  Administered 2023-12-19: 25 ug via INTRAVENOUS
  Administered 2023-12-19: 50 ug via INTRAVENOUS

## 2023-12-19 MED ORDER — BUPIVACAINE-EPINEPHRINE (PF) 0.25% -1:200000 IJ SOLN
INTRAMUSCULAR | Status: AC
  Filled 2023-12-19: qty 30

## 2023-12-19 MED ORDER — OXYCODONE HCL 5 MG PO TABS: 5.0000 mg | ORAL_TABLET | Freq: Once | ORAL | Status: DC | PRN

## 2023-12-19 MED ORDER — OXYCODONE HCL 5 MG/5ML PO SOLN: 5.0000 mg | Freq: Once | ORAL | Status: DC | PRN

## 2023-12-19 MED ORDER — OXYCODONE HCL 5 MG PO TABS
5.0000 mg | ORAL_TABLET | ORAL | Status: DC | PRN
  Administered 2023-12-19 – 2023-12-20 (×3): 10 mg via ORAL
  Filled 2023-12-19 (×3): qty 2

## 2023-12-19 MED ORDER — METHOCARBAMOL 500 MG PO TABS
500.0000 mg | ORAL_TABLET | Freq: Four times a day (QID) | ORAL | Status: DC | PRN
  Administered 2023-12-19: 500 mg via ORAL
  Filled 2023-12-19: qty 1

## 2023-12-19 MED ORDER — ONDANSETRON HCL 4 MG/2ML IJ SOLN: 4.0000 mg | Freq: Four times a day (QID) | INTRAMUSCULAR | Status: DC | PRN

## 2023-12-19 MED ORDER — MIDAZOLAM HCL 2 MG/2ML IJ SOLN
INTRAMUSCULAR | Status: DC | PRN
  Administered 2023-12-19: 2 mg via INTRAVENOUS

## 2023-12-19 MED ORDER — FENTANYL CITRATE (PF) 250 MCG/5ML IJ SOLN
INTRAMUSCULAR | Status: AC
  Filled 2023-12-19: qty 5

## 2023-12-19 MED ORDER — DEXAMETHASONE SODIUM PHOSPHATE 10 MG/ML IJ SOLN
INTRAMUSCULAR | Status: DC | PRN
  Administered 2023-12-19: 10 mg via INTRAVENOUS

## 2023-12-19 MED ORDER — SUGAMMADEX SODIUM 200 MG/2ML IV SOLN
INTRAVENOUS | Status: DC | PRN
  Administered 2023-12-19: 200 mg via INTRAVENOUS

## 2023-12-19 MED ORDER — MORPHINE SULFATE (PF) 2 MG/ML IV SOLN: 1.0000 mg | INTRAVENOUS | Status: DC | PRN

## 2023-12-19 MED ORDER — MIDAZOLAM HCL 2 MG/2ML IJ SOLN
INTRAMUSCULAR | Status: AC
  Filled 2023-12-19: qty 2

## 2023-12-19 MED ORDER — FENTANYL CITRATE (PF) 250 MCG/5ML IJ SOLN
INTRAMUSCULAR | Status: DC | PRN
  Administered 2023-12-19: 100 ug via INTRAVENOUS

## 2023-12-19 MED ORDER — LACTATED RINGERS IV SOLN: INTRAVENOUS | Status: DC | PRN

## 2023-12-19 MED ORDER — DOCUSATE SODIUM 100 MG PO CAPS
100.0000 mg | ORAL_CAPSULE | Freq: Two times a day (BID) | ORAL | Status: DC
  Administered 2023-12-20: 100 mg via ORAL
  Filled 2023-12-19: qty 1

## 2023-12-19 MED ORDER — 0.9 % SODIUM CHLORIDE (POUR BTL) OPTIME
TOPICAL | Status: DC | PRN
  Administered 2023-12-19: 2000 mL

## 2023-12-19 SURGICAL SUPPLY — 40 items
APPLIER CLIP 5 13 M/L LIGAMAX5 (MISCELLANEOUS) ×1 IMPLANT
BAG COUNTER SPONGE SURGICOUNT (BAG) ×1 IMPLANT
BLADE CLIPPER SURG (BLADE) IMPLANT
CANISTER SUCT 3000ML PPV (MISCELLANEOUS) ×1 IMPLANT
CHLORAPREP W/TINT 26 (MISCELLANEOUS) ×1 IMPLANT
CLIP APPLIE 5 13 M/L LIGAMAX5 (MISCELLANEOUS) ×1 IMPLANT
CNTNR URN SCR LID CUP LEK RST (MISCELLANEOUS) IMPLANT
COVER SURGICAL LIGHT HANDLE (MISCELLANEOUS) ×1 IMPLANT
DERMABOND ADVANCED .7 DNX12 (GAUZE/BANDAGES/DRESSINGS) ×1 IMPLANT
ELECT REM PT RETURN 9FT ADLT (ELECTROSURGICAL) ×1 IMPLANT
ELECTRODE REM PT RTRN 9FT ADLT (ELECTROSURGICAL) ×1 IMPLANT
ENDOLOOP SUT PDS II 0 18 (SUTURE) IMPLANT
GLOVE BIO SURGEON STRL SZ8 (GLOVE) ×1 IMPLANT
GLOVE BIOGEL PI IND STRL 8 (GLOVE) ×1 IMPLANT
GOWN STRL REUS W/ TWL LRG LVL3 (GOWN DISPOSABLE) ×2 IMPLANT
GOWN STRL REUS W/ TWL XL LVL3 (GOWN DISPOSABLE) ×1 IMPLANT
IRRIG SUCT STRYKERFLOW 2 WTIP (MISCELLANEOUS) ×1 IMPLANT
IRRIGATION SUCT STRKRFLW 2 WTP (MISCELLANEOUS) ×1 IMPLANT
KIT BASIN OR (CUSTOM PROCEDURE TRAY) ×1 IMPLANT
KIT TURNOVER KIT B (KITS) ×1 IMPLANT
L-HOOK LAP DISP 36CM (ELECTROSURGICAL) ×1 IMPLANT
LHOOK LAP DISP 36CM (ELECTROSURGICAL) ×1 IMPLANT
NDL 22X1.5 STRL (OR ONLY) (MISCELLANEOUS) ×1 IMPLANT
NEEDLE 22X1.5 STRL (OR ONLY) (MISCELLANEOUS) ×1 IMPLANT
NS IRRIG 1000ML POUR BTL (IV SOLUTION) ×1 IMPLANT
PAD ARMBOARD POSITIONER FOAM (MISCELLANEOUS) ×1 IMPLANT
PENCIL BUTTON HOLSTER BLD 10FT (ELECTRODE) ×1 IMPLANT
POUCH RETRIEVAL ECOSAC 10 (ENDOMECHANICALS) ×1 IMPLANT
SCISSORS LAP 5X35 DISP (ENDOMECHANICALS) ×1 IMPLANT
SET TUBE SMOKE EVAC HIGH FLOW (TUBING) ×1 IMPLANT
SLEEVE Z-THREAD 5X100MM (TROCAR) ×2 IMPLANT
SPECIMEN JAR SMALL (MISCELLANEOUS) ×1 IMPLANT
SUT VIC AB 4-0 PS2 27 (SUTURE) ×1 IMPLANT
TOWEL GREEN STERILE (TOWEL DISPOSABLE) ×1 IMPLANT
TOWEL GREEN STERILE FF (TOWEL DISPOSABLE) ×1 IMPLANT
TRAY LAPAROSCOPIC MC (CUSTOM PROCEDURE TRAY) ×1 IMPLANT
TROCAR BALLN 12MMX100 BLUNT (TROCAR) ×1 IMPLANT
TROCAR Z-THREAD OPTICAL 5X100M (TROCAR) ×1 IMPLANT
WARMER LAPAROSCOPE (MISCELLANEOUS) ×1 IMPLANT
WATER STERILE IRR 1000ML POUR (IV SOLUTION) ×1 IMPLANT

## 2023-12-19 NOTE — Plan of Care (Signed)

## 2023-12-19 NOTE — Transfer of Care (Signed)
 Immediate Anesthesia Transfer of Care Note  Patient: Nicole Blevins  Procedure(s) Performed: LAPAROSCOPIC CHOLECYSTECTOMY  Patient Location: PACU  Anesthesia Type:General  Level of Consciousness: awake, alert , and oriented  Airway & Oxygen Therapy: Patient Spontanous Breathing and Patient connected to face mask oxygen  Post-op Assessment: Report given to RN, Post -op Vital signs reviewed and stable, Patient moving all extremities, Patient moving all extremities X 4, and Patient able to stick tongue midline  Post vital signs: Reviewed and stable  Last Vitals:  Vitals Value Taken Time  BP 143/99 12/19/23 1337  Temp    Pulse 89 12/19/23 1340  Resp 25 12/19/23 1340  SpO2 98 % 12/19/23 1340  Vitals shown include unfiled device data.  Last Pain:  Vitals:   12/19/23 0923  TempSrc: Oral  PainSc: 0-No pain         Complications: No notable events documented.

## 2023-12-19 NOTE — Anesthesia Procedure Notes (Signed)
 Procedure Name: Intubation Date/Time: 12/19/2023 12:29 PM  Performed by: Venia Carbon, CRNAPre-anesthesia Checklist: Patient identified, Emergency Drugs available, Suction available, Patient being monitored and Timeout performed Patient Re-evaluated:Patient Re-evaluated prior to induction Oxygen Delivery Method: Circle system utilized Preoxygenation: Pre-oxygenation with 100% oxygen Induction Type: IV induction Ventilation: Mask ventilation without difficulty Laryngoscope Size: Mac and 3 Grade View: Grade I Tube size: 7.0 mm Number of attempts: 1 Airway Equipment and Method: Patient positioned with wedge pillow and Stylet Placement Confirmation: ETT inserted through vocal cords under direct vision, positive ETCO2, CO2 detector and breath sounds checked- equal and bilateral Secured at: 22 cm Tube secured with: Tape

## 2023-12-19 NOTE — Anesthesia Postprocedure Evaluation (Signed)
 Anesthesia Post Note  Patient: Nicole Blevins  Procedure(s) Performed: LAPAROSCOPIC CHOLECYSTECTOMY     Patient location during evaluation: PACU Anesthesia Type: General Level of consciousness: awake and alert Pain management: pain level controlled Vital Signs Assessment: post-procedure vital signs reviewed and stable Respiratory status: spontaneous breathing, nonlabored ventilation, respiratory function stable and patient connected to nasal cannula oxygen Cardiovascular status: blood pressure returned to baseline and stable Postop Assessment: no apparent nausea or vomiting Anesthetic complications: no   No notable events documented.  Last Vitals:  Vitals:   12/19/23 1350 12/19/23 1435  BP: (!) 151/87   Pulse: 83   Resp: 17   Temp:  36.9 C  SpO2: (!) 87%     Last Pain:  Vitals:   12/19/23 1435  TempSrc:   PainSc: Asleep                 Gerica Koble S

## 2023-12-19 NOTE — Op Note (Signed)
 12/18/2023 - 12/19/2023  1:18 PM  PATIENT:  Nicole Blevins  53 y.o. female  PRE-OPERATIVE DIAGNOSIS:  Acute Cholecystitis  POST-OPERATIVE DIAGNOSIS:  Acute Cholecystitis  PROCEDURE:  Procedure(s): LAPAROSCOPIC CHOLECYSTECTOMY  SURGEON:  Surgeon(s): Violeta Gelinas, MD  ASSISTANTS: Llana Aliment, RNFA   ANESTHESIA:   local and general  EBL:  No intake/output data recorded.  BLOOD ADMINISTERED:none  DRAINS: none   SPECIMEN:  Excision  DISPOSITION OF SPECIMEN:  PATHOLOGY  COUNTS:  YES  DICTATION: .Dragon Dictation Procedure in detail: Informed consent obtained.  She received intravenous antibiotics.  She was brought to the operating room and general endotracheal anesthesia was administered by the anesthesia staff.  Her abdomen was prepped and draped in sterile fashion.  Timeout procedure was performed.The infraumbilical region was infiltrated with local. Infraumbilical incision was made. Subcutaneous tissues were dissected down revealing the anterior fascia. This was divided sharply along the midline. Peritoneal cavity was entered under direct vision without complication. A 0 Vicryl pursestring was placed around the fascial opening. Hassan trocar was inserted into the abdomen. The abdomen was insufflated with carbon dioxide in standard fashion. Under direct vision a 5 mm epigastric and two 5 mm right sided abdominal ports were placed.  Local was used at each port site.  Abdominal exploration revealed a lot of adhesions over in the right upper quadrant these were mostly omentum which I carefully took down using cautery.  The dome of the gallbladder was visible but most of the gallbladder was encapsulated in omentum.  I retracted the dome superiorly.  The omentum encasing the body was gradually and carefully taken down.  Cautery was used to get good hemostasis.  This revealed the infundibulum eventually.  The infundibulum was retracted laterally.  The dome was then retracted superior and  medially.  Dissection began laterally and progressed medially.  There was a large stone in the infundibulum.  The cystic duct was clearly identified.  Dissection continued until we had a critical view of safety.  3 clips were placed proximally on the cystic duct and 1 was placed distally.  The common bile duct was easily visible and was quite adherent to the underside of the infundibulum.  In light of this, I transition to a dome down technique in order to protect vital structures.  The gallbladder was taken down from the liver bed gradually from the dome extending towards the infundibulum.  Once I got to a safe location I placed 2 PDS Endoloops around the infundibulum just underneath the stone and tied them securely.  The gallbladder was divided over the stone.  The gallbladder was removed and placed in a bag.  It was taken out of the abdomen and sent to pathology.  The stone was then placed in an additional bag and removed from the abdomen.  It was also sent to pathology.  The cuff of infundibulum was cauterized and there was excellent hemostasis.  The liver bed was also irrigated and I used cautery to get excellent hemostasis.  The cystic duct clips remained in good position.  The cystic artery appeared well-controlled within the Endoloops.  There was no bleeding.  Four-quadrant inspection revealed no complicating features.  Ports were removed under direct vision.  Pneumoperitoneum was released.  Infraumbilical fascia was closed by tying the pursestring.  All 4 wounds were closed with 4-0 Vicryl followed by Dermabond.  All counts were correct.  She tolerated the procedure well without apparent complication and was taken recovery in stable condition.  PATIENT DISPOSITION:  PACU -  hemodynamically stable.   Delay start of Pharmacological VTE agent (>24hrs) due to surgical blood loss or risk of bleeding:  no  Violeta Gelinas, MD, MPH, FACS Pager: [phone]  4/11/20251:18 PM

## 2023-12-19 NOTE — Anesthesia Preprocedure Evaluation (Signed)
 Anesthesia Evaluation  Patient identified by MRN, date of birth, ID band Patient awake    Reviewed: Allergy & Precautions, H&P , NPO status , Patient's Chart, lab work & pertinent test results  Airway Mallampati: II   Neck ROM: full    Dental   Pulmonary Current Smoker and Patient abstained from smoking.   breath sounds clear to auscultation       Cardiovascular negative cardio ROS  Rhythm:regular Rate:Normal     Neuro/Psych Seizures -,     GI/Hepatic   Endo/Other    Renal/GU      Musculoskeletal   Abdominal   Peds  Hematology   Anesthesia Other Findings   Reproductive/Obstetrics                             Anesthesia Physical Anesthesia Plan  ASA: 2  Anesthesia Plan: General   Post-op Pain Management:    Induction: Intravenous  PONV Risk Score and Plan: 2 and Ondansetron, Dexamethasone, Midazolam and Treatment may vary due to age or medical condition  Airway Management Planned: Oral ETT  Additional Equipment:   Intra-op Plan:   Post-operative Plan: Extubation in OR  Informed Consent: I have reviewed the patients History and Physical, chart, labs and discussed the procedure including the risks, benefits and alternatives for the proposed anesthesia with the patient or authorized representative who has indicated his/her understanding and acceptance.     Dental advisory given  Plan Discussed with: CRNA, Anesthesiologist and Surgeon  Anesthesia Plan Comments:        Anesthesia Quick Evaluation

## 2023-12-19 NOTE — Progress Notes (Signed)
 Patient ID: Nicole Blevins, female   DOB: [birth year], 53 y.o.   MRN: [id number] * Day of Surgery *    Subjective: RUQ pain. Reports she has been granted early release from [place]. ROS negative except as listed above. Objective: Vital signs in last 24 hours: Temp:  [97.9 F (36.6 C)-98.9 F (37.2 C)] 97.9 F (36.6 C) (04/11 0923) Pulse Rate:  [63-82] 64 (04/11 0923) Resp:  [15-26] 18 (04/11 0923) BP: (101-154)/(66-96) 127/91 (04/11 0923) SpO2:  [96 %-100 %] 98 % (04/11 0923) Weight:  [86.2 kg] 86.2 kg (04/10 1308) Last BM Date : 12/18/23  Intake/Output from previous day: 04/10 0701 - 04/11 0700 In: 418.1 [I.V.:318.1; IV Piggyback:100] Out: -  Intake/Output this shift: No intake/output data recorded.  General appearance: alert and cooperative Resp: clear to auscultation bilaterally GI: soft, tender RUQ, no peritonitis Extremities: calves soft/  Lab Results: CBC  Recent Labs    12/18/23 1240  WBC 6.8  HGB 14.2  HCT 42.3  PLT 316   BMET Recent Labs    12/18/23 1240 12/19/23 0511  NA 135 136  K 4.0 3.7  CL 101 101  CO2 22 25  GLUCOSE 96 92  BUN 5* 10  CREATININE 0.78 0.91  CALCIUM 9.5 9.0   PT/INR No results for input(s): "LABPROT", "INR" in the last 72 hours. ABG No results for input(s): "PHART", "HCO3" in the last 72 hours.  Invalid input(s): "PCO2", "PO2"  Studies/Results: CT Renal Stone Study Result Date: 12/18/2023 CLINICAL DATA:  Abdominal/flank pain, stone suspected EXAM: CT ABDOMEN AND PELVIS WITHOUT CONTRAST TECHNIQUE: Multidetector CT imaging of the abdomen and pelvis was performed following the standard protocol without IV contrast. Of note, the lack of intravenous contrast limits evaluation of the solid organ parenchyma and vascularity. RADIATION DOSE REDUCTION: This exam was performed according to the departmental dose-optimization program which includes automated exposure control, adjustment of the mA and/or kV according to patient size and/or  use of iterative reconstruction technique. COMPARISON:  Jan 16, 2023 FINDINGS: Lower chest: No focal airspace consolidation or pleural effusion. Hepatobiliary: No mass.Fluid-filled, distended gallbladder with a large gallstone in the gallbladder neck. Mild wall thickening along the hepatic aspect of the gallbladder. No pericholecystic inflammation.Mild intrahepatic biliary ductal dilation. The common bile duct is also dilated measuring 9 mm. No radiopaque choledocholithiasis. Pancreas: No mass or main ductal dilation.No peripancreatic inflammation or fluid collection. Spleen: Normal size. No mass. Adrenals/Urinary Tract: No adrenal masses. No renal mass. No hydronephrosis or nephrolithiasis. The urinary bladder is completely decompressed. Stomach/Bowel: The stomach contains ingested material without focal abnormality. No small bowel wall thickening or inflammation. No small bowel obstruction. Normal appendix. Vascular/Lymphatic: No aortic aneurysm. Diffuse aortoiliac atherosclerosis. No intraabdominal or pelvic lymphadenopathy. Reproductive: Enlarged, bulky uterus containing multiple masses, likely fibroids. The largest mass along the left uterine fundus measures 7 cm.3 cm cyst in the left ovary. 3.1 cm hypodensity along the right hemipelvis, possibly the right ovary containing a cyst or an exophytic, subserosal fibroid. No free pelvic fluid. Other: No pneumoperitoneum, ascites, or mesenteric inflammation. Musculoskeletal: No acute fracture or destructive lesion.Multilevel thoracic osteophytosis. IMPRESSION: 1. Mild intrahepatic biliary ductal dilation with dilation of the common bile duct measuring 9 mm. While no radiopaque choledocholithiasis was visualized, correlation with serum bilirubin recommended. A nonemergent multiphase abdominal MRI with IV contrast should be considered to exclude downstream obstruction. 2. Fluid-filled, distended gallbladder with a large gallstone in the neck. Mild wall thickening along  the hepatic aspect of the gallbladder without pericholecystic inflammation, may  be related to biliary stasis or acute hepatitis. Correlation with liver enzymes recommended. If there is concern for acute cholecystitis, a nuclear medicine hepatobiliary scan would be recommended. 3. In the left ovary, there is a 3 cm cyst. 3.1 cm hypodensity along the right hemipelvis, possibly a cyst in the right ovary versus exophytic fibroid. 4. Enlarged, fibroid uterus. Electronically Signed   By: Wallie Char M.D.   On: 12/18/2023 17:52   US Abdomen Limited RUQ (LIVER/GB) Result Date: 12/18/2023 CLINICAL DATA:  151471 RUQ pain 151471 EXAM: ULTRASOUND ABDOMEN LIMITED RIGHT UPPER QUADRANT COMPARISON:  CT scan abdomen and pelvis from 01/16/2023 FINDINGS: Gallbladder: The gallbladder is physiologically distended. There is a 2.6 cm gallstones, which appears stuck in the gallbladder neck region. There is no movement of gallstone on decubitus images. However, the gallbladder is otherwise unremarkable. No abnormal wall thickening. Sonographic Murphy sign was negative. No pericholecystic free fluid. Common bile duct: Diameter: Up to 6.5 mm.  No intrahepatic bile duct dilation. Liver: There is mildly increased hepatic echogenicity which reduces the sensitivity of ultrasound for the detection of focal masses. That being said, no focal mass is identified. Portal vein is patent on color Doppler imaging with normal direction of blood flow towards the liver. Other: None. IMPRESSION: 1. There is a 2.6 cm gallstone stuck in the gallbladder neck region; however, without sonographic evidence of acute cholecystitis. 2. Mildly increased hepatic echogenicity, a nonspecific finding that is most commonly seen on the basis of steatosis in the absence of known liver disease. Electronically Signed   By: Jules Schick M.D.   On: 12/18/2023 14:10    Anti-infectives: Anti-infectives (From admission, onward)    Start     Dose/Rate Route Frequency  Ordered Stop   12/18/23 2045  [MAR Hold]  cefTRIAXone (ROCEPHIN) 2 g in sodium chloride 0.9 % 100 mL IVPB        (MAR Hold since Fri 12/19/2023 at 0918.Hold Reason: Transfer to a Procedural area)   2 g 200 mL/hr over 30 Minutes Intravenous Every 24 hours 12/18/23 2038 12/25/23 2044       Assessment/Plan: CHolecystitis with cholelithiasis - for laparoscopic cholecystectomy, possible IOC. Procedure, risks, and benefits discussed. She agrees. Plan OBS overnight post-op.   LOS: 0 days    Violeta Gelinas, MD, MPH, FACS Trauma & General Surgery Use [email or web address] to contact on call provider  12/19/2023

## 2023-12-19 NOTE — Discharge Instructions (Signed)

## 2023-12-20 ENCOUNTER — Encounter (HOSPITAL_COMMUNITY): Payer: Self-pay | Admitting: General Surgery

## 2023-12-20 ENCOUNTER — Other Ambulatory Visit (HOSPITAL_COMMUNITY): Payer: Self-pay

## 2023-12-20 MED ORDER — METHOCARBAMOL 500 MG PO TABS
500.0000 mg | ORAL_TABLET | Freq: Three times a day (TID) | ORAL | 0 refills | Status: AC | PRN
  Filled 2023-12-20: qty 45, 15d supply, fill #0

## 2023-12-20 MED ORDER — ACETAMINOPHEN 500 MG PO TABS: 1000.0000 mg | ORAL_TABLET | Freq: Three times a day (TID) | ORAL | Status: AC | PRN

## 2023-12-20 MED ORDER — OXYCODONE HCL 5 MG PO TABS
5.0000 mg | ORAL_TABLET | Freq: Four times a day (QID) | ORAL | 0 refills | Status: DC | PRN
  Filled 2023-12-20: qty 15, 4d supply, fill #0

## 2023-12-20 MED ORDER — POLYETHYLENE GLYCOL 3350 17 G PO PACK
17.0000 g | PACK | Freq: Every day | ORAL | Status: AC | PRN
Start: 2023-12-20 — End: ?

## 2023-12-20 NOTE — Progress Notes (Signed)
 Patient verbalized understanding of dc instructions.all belongings  and paperwork given to patient.
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
## 2023-12-20 NOTE — Plan of Care (Signed)

## 2023-12-22 LAB — SURGICAL PATHOLOGY

## 2024-01-05 ENCOUNTER — Encounter (HOSPITAL_COMMUNITY): Payer: Self-pay

## 2024-01-05 ENCOUNTER — Other Ambulatory Visit: Payer: Self-pay

## 2024-01-05 ENCOUNTER — Other Ambulatory Visit (HOSPITAL_COMMUNITY): Payer: Self-pay

## 2024-01-05 ENCOUNTER — Emergency Department (HOSPITAL_COMMUNITY)
Admission: EM | Admit: 2024-01-05 | Discharge: 2024-01-05 | Disposition: A | Discharge status: Home / Self Care | Attending: Emergency Medicine | Admitting: Emergency Medicine

## 2024-01-05 DIAGNOSIS — Z9049 Acquired absence of other specified parts of digestive tract: Secondary | ICD-10-CM | POA: Diagnosis not present

## 2024-01-05 DIAGNOSIS — T8141XA Infection following a procedure, superficial incisional surgical site, initial encounter: Secondary | ICD-10-CM | POA: Insufficient documentation

## 2024-01-05 DIAGNOSIS — T8149XA Infection following a procedure, other surgical site, initial encounter: Secondary | ICD-10-CM

## 2024-01-05 LAB — CBC
HCT: 39 % (ref 36.0–46.0)
Hemoglobin: 12.4 g/dL (ref 12.0–15.0)
MCH: 32.6 pg (ref 26.0–34.0)
MCHC: 31.8 g/dL (ref 30.0–36.0)
MCV: 102.6 fL — ABNORMAL HIGH (ref 80.0–100.0)
Platelets: 525 10*3/uL — ABNORMAL HIGH (ref 150–400)
RBC: 3.8 MIL/uL — ABNORMAL LOW (ref 3.87–5.11)
RDW: 12.5 % (ref 11.5–15.5)
WBC: 6.1 10*3/uL (ref 4.0–10.5)
nRBC: 0 % (ref 0.0–0.2)

## 2024-01-05 LAB — URINALYSIS, ROUTINE W REFLEX MICROSCOPIC
Bilirubin Urine: NEGATIVE
Glucose, UA: NEGATIVE mg/dL
Hgb urine dipstick: NEGATIVE
Ketones, ur: NEGATIVE mg/dL
Leukocytes,Ua: NEGATIVE
Nitrite: NEGATIVE
Protein, ur: NEGATIVE mg/dL
Specific Gravity, Urine: 1.023 (ref 1.005–1.030)
pH: 6 (ref 5.0–8.0)

## 2024-01-05 LAB — COMPREHENSIVE METABOLIC PANEL WITH GFR
ALT: 8 U/L (ref 0–44)
AST: 10 U/L — ABNORMAL LOW (ref 15–41)
Albumin: 3.1 g/dL — ABNORMAL LOW (ref 3.5–5.0)
Alkaline Phosphatase: 39 U/L (ref 38–126)
Anion gap: 8 (ref 5–15)
BUN: 12 mg/dL (ref 6–20)
CO2: 24 mmol/L (ref 22–32)
Calcium: 9 mg/dL (ref 8.9–10.3)
Chloride: 106 mmol/L (ref 98–111)
Creatinine, Ser: 0.83 mg/dL (ref 0.44–1.00)
GFR, Estimated: >60 mL/min (ref >60)
Glucose, Bld: 89 mg/dL (ref 70–99)
Potassium: 3.8 mmol/L (ref 3.5–5.1)
Sodium: 138 mmol/L (ref 135–145)
Total Bilirubin: 0.5 mg/dL (ref 0.0–1.2)
Total Protein: 6.5 g/dL (ref 6.5–8.1)

## 2024-01-05 LAB — HCG, SERUM, QUALITATIVE: Preg, Serum: NEGATIVE

## 2024-01-05 LAB — LIPASE, BLOOD: Lipase: 27 U/L (ref 11–51)

## 2024-01-05 MED ORDER — OXYCODONE HCL 5 MG PO TABS
5.0000 mg | ORAL_TABLET | Freq: Four times a day (QID) | ORAL | 0 refills | Status: AC | PRN
  Filled 2024-01-05: qty 15, 4d supply, fill #0

## 2024-01-05 MED ORDER — AMOXICILLIN-POT CLAVULANATE 875-125 MG PO TABS
1.0000 | ORAL_TABLET | Freq: Two times a day (BID) | ORAL | 0 refills | Status: AC
  Filled 2024-01-05: qty 14, 7d supply, fill #0

## 2024-01-05 MED ORDER — IBUPROFEN 600 MG PO TABS
600.0000 mg | ORAL_TABLET | Freq: Four times a day (QID) | ORAL | 0 refills | Status: DC | PRN
  Filled 2024-01-05: qty 30, 8d supply, fill #0

## 2024-01-05 NOTE — ED Triage Notes (Signed)
 Patient had gallbladder removal approx 2 weeks ago and now has oozing from belly button and another incision.  Patient has heat and redness to belly button.

## 2024-01-05 NOTE — Progress Notes (Signed)
 Progress Note     Subjective: Pt is s/p laparoscopic cholecystectomy on 12/19/23 by Dr. Hildy Lowers. She was discharged 12/20/23 in stable condition. She was doing well post-operatively until a few days ago when she started noting some drainage from umbilical and most lateral incision. Drainage initially whitish and now a little more brown. She has increased pain at umbilicus. Had some nausea yesterday but this is improved. She is still passing flatus and having bowel function. She is showering daily.   Objective: Vital signs in last 24 hours: Temp:  [97.8 F (36.6 C)] 97.8 F (36.6 C) (04/28 1202) Pulse Rate:  [62-70] 62 (04/28 1300) Resp:  [18] 18 (04/28 1300) BP: (162-191)/(85-99) 162/85 (04/28 1300) SpO2:  [97 %-100 %] 99 % (04/28 1300) Weight:  [86.2 kg] 86.2 kg (04/28 1202)    Intake/Output from previous day: No intake/output data recorded. Intake/Output this shift: No intake/output data recorded.  PE: General: pleasant, WD, WN female who is laying in bed in NAD HEENT: sclera anicteric  Heart: regular, rate, and rhythm.   Lungs: Respiratory effort nonlabored Abd: soft, ND, ttp around umbilical and R lateral incisions with erythema and warmth, scant purulent drainage to both sites, Umbilical incision gently probed and no large cavity present Psych: A&Ox3 with an appropriate affect.    Lab Results:  Recent Labs    01/05/24 1204  WBC 6.1  HGB 12.4  HCT 39.0  PLT 525*   BMET Recent Labs    01/05/24 1204  NA 138  K 3.8  CL 106  CO2 24  GLUCOSE 89  BUN 12  CREATININE 0.83  CALCIUM 9.0   PT/INR No results for input(s): "LABPROT", "INR" in the last 72 hours. CMP     Component Value Date/Time   NA 138 01/05/2024 1204   K 3.8 01/05/2024 1204   CL 106 01/05/2024 1204   CO2 24 01/05/2024 1204   GLUCOSE 89 01/05/2024 1204   BUN 12 01/05/2024 1204   CREATININE 0.83 01/05/2024 1204   CALCIUM 9.0 01/05/2024 1204   PROT 6.5 01/05/2024 1204   ALBUMIN 3.1 (L)  01/05/2024 1204   AST 10 (L) 01/05/2024 1204   ALT 8 01/05/2024 1204   ALKPHOS 39 01/05/2024 1204   BILITOT 0.5 01/05/2024 1204   GFRNONAA >60 01/05/2024 1204   GFRAA >60 04/15/2020 0746   Lipase     Component Value Date/Time   LIPASE 27 01/05/2024 1204       Studies/Results: No results found.  Anti-infectives: Anti-infectives (From admission, onward)    Start     Dose/Rate Route Frequency Ordered Stop   01/05/24 0000  amoxicillin-clavulanate (AUGMENTIN) 875-125 MG tablet        1 tablet Oral 2 times daily 01/05/24 1337 01/12/24 2359        Assessment/Plan  S/P lap chole on 12/19/23 Cellulitis at lateral and umbilical port sites - no large fluctuant cavity on probing, scant purulent appearing drainage - recommend 7 days PO abx and then follow up in office as scheduled on 5/8 for wound check - no significant leukocytosis and afebrile here - refilled oxycodone  15 tablets of 5 mg q6h prn as well   - LFTs fine and tolerating diet so no significant concern of intra-abdominal post-op complication  - discussed with EDP, stable for DC home    LOS: 0 days      Annetta Killian, [facility] 01/05/2024, 1:39 PM Please see Amion for pager number during day hours 7:00am-4:30pm

## 2024-01-05 NOTE — Discharge Instructions (Signed)
 Return for rapid spreading redness, fever.   Take the antibiotics as prescribed.

## 2024-01-05 NOTE — ED Provider Notes (Signed)
 [place] EMERGENCY DEPARTMENT AT [facility] Provider Note   CSN: [id number] Arrival date & time: 01/05/24  1151     History  Chief Complaint  Patient presents with   Post-op Problem    Nicole Blevins is a 53 y.o. female.  53 yo F with a cc of erythema and drainage to 2 of her surgical sites.  Patient had a laparoscopic cholecystectomy done on 12/19/23.  She had been doing well until couple days ago she noticed that she had some drainage on her shirt.  Has developed some redness there.  She is little bit worried that she has had some subjective fevers.  No nausea no vomiting.        Home Medications Prior to Admission medications   Medication Sig Start Date End Date Taking? Authorizing Provider  amoxicillin-clavulanate (AUGMENTIN) 875-125 MG tablet Take 1 tablet by mouth 2 (two) times daily for 7 days. 01/05/24 01/12/24 Yes Annetta Killian, PA-C  acetaminophen  (TYLENOL ) 500 MG tablet Take 2 tablets (1,000 mg total) by mouth every 8 (eight) hours as needed for moderate pain (pain score 4-6). 12/20/23   Maczis, Michael M, PA-C  amLODipine (NORVASC) 10 MG tablet Take 10 mg by mouth daily.    [provider]  docusate sodium  (COLACE) 100 MG capsule Take 100 mg by mouth 2 (two) times daily as needed for mild constipation.    [provider]  ferrous sulfate 325 (65 FE) MG EC tablet Take 325 mg by mouth daily.    [provider]  ibuprofen (ADVIL) 600 MG tablet Take 1 tablet (600 mg total) by mouth every 6 (six) hours as needed for mild pain (pain score 1-3). TAKE WITH FOOD 01/05/24   Annetta Killian, PA-C  losartan (COZAAR) 25 MG tablet Take 25 mg by mouth daily.    [provider]  methocarbamol  (ROBAXIN ) 500 MG tablet Take 1 tablet (500 mg total) by mouth every 8 (eight) hours as needed for muscle spasms. 12/20/23   Maczis, Michael M, PA-C  oxyCODONE  (OXY IR/ROXICODONE ) 5 MG immediate release tablet Take 1 tablet (5 mg total) by mouth every 6  (six) hours as needed for moderate pain (pain score 4-6). 01/05/24   Annetta Killian, PA-C  polyethylene glycol (MIRALAX  / GLYCOLAX ) 17 g packet Take 17 g by mouth daily as needed for mild constipation. 12/20/23   Maczis, Michael M, PA-C  senna (SENOKOT) 8.6 MG tablet Take 1 tablet by mouth daily as needed for constipation.    [provider]      Allergies    Tomato    Review of Systems   Review of Systems  Physical Exam Updated Vital Signs BP (!) 162/85   Pulse 62   Temp 98 F (36.7 C)   Resp 18   Ht 5\' 3"  (1.6 m)   Wt 86.2 kg   LMP 11/22/2023   SpO2 99%   BMI 33.66 kg/m  Physical Exam Vitals and nursing note reviewed.  Constitutional:      General: She is not in acute distress.    Appearance: She is well-developed. She is not diaphoretic.  HENT:     Head: Normocephalic and atraumatic.  Eyes:     Pupils: Pupils are equal, round, and reactive to light.  Cardiovascular:     Rate and Rhythm: Normal rate and regular rhythm.     Heart sounds: No murmur heard.    No friction rub. No gallop.  Pulmonary:  Effort: Pulmonary effort is normal.     Breath sounds: No wheezing or rales.  Abdominal:     General: There is no distension.     Palpations: Abdomen is soft.     Tenderness: There is no abdominal tenderness.  Musculoskeletal:        General: No tenderness.     Cervical back: Normal range of motion and neck supple.  Skin:    General: Skin is warm and dry.  Neurological:     Mental Status: She is alert and oriented to person, place, and time.  Psychiatric:        Behavior: Behavior normal.          ED Results / Procedures / Treatments   Labs (all labs ordered are listed, but only abnormal results are displayed) Labs Reviewed  COMPREHENSIVE METABOLIC PANEL WITH GFR - Abnormal; Notable for the following components:      Result Value   Albumin 3.1 (*)    AST 10 (*)    All other components within normal limits  CBC - Abnormal; Notable for the  following components:   RBC 3.80 (*)    MCV 102.6 (*)    Platelets 525 (*)    All other components within normal limits  URINALYSIS, ROUTINE W REFLEX MICROSCOPIC - Abnormal; Notable for the following components:   APPearance HAZY (*)    All other components within normal limits  LIPASE, BLOOD  HCG, SERUM, QUALITATIVE    EKG None  Radiology No results found.  Procedures Procedures    Medications Ordered in ED Medications - No data to display  ED Course/ Medical Decision Making/ A&P                                 Medical Decision Making Amount and/or Complexity of Data Reviewed Labs: ordered.   53 yo F with a chief complaints of redness and drainage from her laparoscopic sites from a procedure done about 17 days ago.  On exam it looks like these are superficial.  She has no obvious abdominal discomfort otherwise.  She does have some small amount of purulent drainage of the umbilicus.  Will discuss with general surgery.  No leukocytosis, no significant electrolyte abnormalities.    Gen surgery eval at bedside recommend oral abx.  Outpatient follow up.   2:11 PM:  I have discussed the diagnosis/risks/treatment options with the patient.  Evaluation and diagnostic testing in the emergency department does not suggest an emergent condition requiring admission or immediate intervention beyond what has been performed at this time.  They will follow up with PCP. We also discussed returning to the ED immediately if new or worsening sx occur. We discussed the sx which are most concerning (e.g., sudden worsening pain, fever, inability to tolerate by mouth) that necessitate immediate return. Medications administered to the patient during their visit and any new prescriptions provided to the patient are listed below.  Medications given during this visit Medications - No data to display   The patient appears reasonably screen and/or stabilized for discharge and I doubt any other  medical condition or other [facility] requiring further screening, evaluation, or treatment in the ED at this time prior to discharge.          Final Clinical Impression(s) / ED Diagnoses Final diagnoses:  Surgical site infection    Rx / DC Orders ED Discharge Orders  Ordered    ibuprofen (ADVIL) 600 MG tablet  Every 6 hours PRN        01/05/24 1337    oxyCODONE  (OXY IR/ROXICODONE ) 5 MG immediate release tablet  Every 6 hours PRN        01/05/24 1337    amoxicillin-clavulanate (AUGMENTIN) 875-125 MG tablet  2 times daily        01/05/24 1337              Albertus Hughs, DO 01/05/24 1411

## 2024-02-09 ENCOUNTER — Encounter: Payer: Self-pay | Admitting: Physician Assistant

## 2024-10-13 ENCOUNTER — Emergency Department (HOSPITAL_COMMUNITY)
Admission: EM | Admit: 2024-10-13 | Discharge: 2024-10-13 | Disposition: A | Discharge status: Home / Self Care | Source: Home / Self Care | Attending: Emergency Medicine | Admitting: Emergency Medicine

## 2024-10-13 ENCOUNTER — Encounter (HOSPITAL_COMMUNITY): Payer: Self-pay

## 2024-10-13 ENCOUNTER — Other Ambulatory Visit: Payer: Self-pay

## 2024-10-13 ENCOUNTER — Emergency Department (HOSPITAL_COMMUNITY)

## 2024-10-13 DIAGNOSIS — K047 Periapical abscess without sinus: Secondary | ICD-10-CM

## 2024-10-13 LAB — CBC WITH DIFFERENTIAL/PLATELET
Abs Immature Granulocytes: 0.03 10*3/uL (ref 0.00–0.07)
Basophils Absolute: 0 10*3/uL (ref 0.0–0.1)
Basophils Relative: 0 %
Eosinophils Absolute: 0.1 10*3/uL (ref 0.0–0.5)
Eosinophils Relative: 1 %
HCT: 42.2 % (ref 36.0–46.0)
Hemoglobin: 14 g/dL (ref 12.0–15.0)
Immature Granulocytes: 0 %
Lymphocytes Relative: 12 %
Lymphs Abs: 1 10*3/uL (ref 0.7–4.0)
MCH: 32.8 pg (ref 26.0–34.0)
MCHC: 33.2 g/dL (ref 30.0–36.0)
MCV: 98.8 fL (ref 80.0–100.0)
Monocytes Absolute: 0.7 10*3/uL (ref 0.1–1.0)
Monocytes Relative: 9 %
Neutro Abs: 6.5 10*3/uL (ref 1.7–7.7)
Neutrophils Relative %: 78 %
Platelets: 354 10*3/uL (ref 150–400)
RBC: 4.27 MIL/uL (ref 3.87–5.11)
RDW: 12.6 % (ref 11.5–15.5)
WBC: 8.3 10*3/uL (ref 4.0–10.5)
nRBC: 0 % (ref 0.0–0.2)

## 2024-10-13 LAB — BASIC METABOLIC PANEL WITH GFR
Anion gap: 12 (ref 5–15)
BUN: 15 mg/dL (ref 6–20)
CO2: 22 mmol/L (ref 22–32)
Calcium: 9.5 mg/dL (ref 8.9–10.3)
Chloride: 102 mmol/L (ref 98–111)
Creatinine, Ser: 1.02 mg/dL — ABNORMAL HIGH (ref 0.44–1.00)
GFR, Estimated: >60 mL/min
Glucose, Bld: 98 mg/dL (ref 70–99)
Potassium: 3.8 mmol/L (ref 3.5–5.1)
Sodium: 137 mmol/L (ref 135–145)

## 2024-10-13 MED ORDER — ACETAMINOPHEN 500 MG PO TABS
1000.0000 mg | ORAL_TABLET | Freq: Once | ORAL | Status: AC
  Administered 2024-10-13: 1000 mg via ORAL
  Filled 2024-10-13: qty 2

## 2024-10-13 MED ORDER — AMOXICILLIN-POT CLAVULANATE 875-125 MG PO TABS: 1.0000 | ORAL_TABLET | Freq: Two times a day (BID) | ORAL | 0 refills | Status: AC

## 2024-10-13 MED ORDER — IOHEXOL 350 MG/ML SOLN
75.0000 mL | Freq: Once | INTRAVENOUS | Status: AC | PRN
  Administered 2024-10-13: 75 mL via INTRAVENOUS

## 2024-10-13 MED ORDER — AMOXICILLIN-POT CLAVULANATE 875-125 MG PO TABS
1.0000 | ORAL_TABLET | Freq: Once | ORAL | Status: AC
  Administered 2024-10-13: 1 via ORAL
  Filled 2024-10-13: qty 1

## 2024-10-13 MED ORDER — IBUPROFEN 800 MG PO TABS: 800.0000 mg | ORAL_TABLET | Freq: Three times a day (TID) | ORAL | 0 refills | Status: AC | PRN

## 2024-10-13 MED ORDER — IBUPROFEN 400 MG PO TABS
600.0000 mg | ORAL_TABLET | Freq: Once | ORAL | Status: AC
  Administered 2024-10-13: 600 mg via ORAL
  Filled 2024-10-13: qty 1

## 2024-10-13 MED ORDER — LIDOCAINE HCL (PF) 1 % IJ SOLN
5.0000 mL | Freq: Once | INTRAMUSCULAR | Status: AC
  Administered 2024-10-13: 5 mL
  Filled 2024-10-13: qty 5

## 2024-10-13 MED ORDER — KETOROLAC TROMETHAMINE 15 MG/ML IJ SOLN
15.0000 mg | Freq: Once | INTRAMUSCULAR | Status: AC
  Administered 2024-10-13: 15 mg via INTRAVENOUS
  Filled 2024-10-13: qty 1

## 2024-10-13 NOTE — ED Provider Triage Note (Cosign Needed Addendum)
 Emergency Medicine Provider Triage Evaluation Note  Nicole Blevins , a 54 y.o. female  was evaluated in triage.  Pt complains of something in my nose that started this morning. Denies any foreign body in her nose. She reports pain and swelling to her left upper lip/nose. Denies any injuries, recent dental work, fever, or chills at home. Denies any new medications, foods, no rashes. Denies any difficulty with swallowing or any throat pain. No shortness of breath.   Review of Systems  Positive: As above Negative: As above  Physical Exam  BP (!) 185/129 (BP Location: Right Arm)   Pulse (!) 104   Temp 98.8 F (37.1 C)   Resp 18   Ht 5' 3 (1.6 m)   Wt 86.2 kg   SpO2 99%   BMI 33.66 kg/m  Gen:   Awake, no distress   Resp:  Normal effort  MSK:   Moves extremities without difficulty  Other:  Edema to the left upper lip extending into the nasal cavity of the left nare. No fluctance or drainage to suggest abscess. No signs of angioedema. Talking in full sentences without difficulty. Swallowing without difficulty  Medical Decision Making  Medically screening exam initiated at 2:10 PM.  Appropriate orders placed.  [name] was informed that the remainder of the evaluation will be completed by another provider, this initial triage assessment does not replace that evaluation, and the importance of remaining in the ED until their evaluation is complete.   Veta Palma, PA-C 10/13/24 1409   Patient's exam seems consistent with cellulitis. Patient very adament that something is in my nose and wants more testing CT head and labs ordered for thorough evaluation   Veta Palma, PA-C 10/13/24 1410

## 2024-10-13 NOTE — ED Triage Notes (Addendum)
 Pt reports facial swelling that started this morning about 0900, started as small lump on inside of her mouth and has continued to increase in size. Reports it feels like an abscess.

## 2024-10-13 NOTE — ED Provider Notes (Signed)
 " [place] EMERGENCY DEPARTMENT AT [facility] Provider Note   CSN: [id number] Arrival date & time: 10/13/24  1328     Patient presents with: Facial Swelling   Nicole Blevins is a 54 y.o. female with no significant past medical history who presents with concern for something in my nose that started this morning. Denies any foreign body in her nose. She reports pain and swelling to her left upper lip/nose. Denies any injuries, recent dental work, fever, or chills at home. Denies any new medications, foods, no rashes. Denies any difficulty with swallowing or any throat pain. No shortness of breath.    HPI     Prior to Admission medications  Medication Sig Start Date End Date Taking? Authorizing Provider  amoxicillin -clavulanate (AUGMENTIN ) 875-125 MG tablet Take 1 tablet by mouth 2 (two) times daily. 10/14/24  Yes Veta Palma, PA-C  acetaminophen  (TYLENOL ) 500 MG tablet Take 2 tablets (1,000 mg total) by mouth every 8 (eight) hours as needed for moderate pain (pain score 4-6). 12/20/23   Maczis, Michael M, PA-C  amLODipine (NORVASC) 10 MG tablet Take 10 mg by mouth daily.    [provider]  docusate sodium  (COLACE) 100 MG capsule Take 100 mg by mouth 2 (two) times daily as needed for mild constipation.    [provider]  ferrous sulfate 325 (65 FE) MG EC tablet Take 325 mg by mouth daily.    [provider]  ibuprofen  (ADVIL ) 600 MG tablet Take 1 tablet (600 mg total) by mouth every 6 (six) hours as needed for mild pain (pain score 1-3). TAKE WITH FOOD 01/05/24   Vicci Burnard SAUNDERS, PA-C  losartan (COZAAR) 25 MG tablet Take 25 mg by mouth daily.    [provider]  methocarbamol  (ROBAXIN ) 500 MG tablet Take 1 tablet (500 mg total) by mouth every 8 (eight) hours as needed for muscle spasms. 12/20/23   Maczis, Michael M, PA-C  oxyCODONE  (OXY IR/ROXICODONE ) 5 MG immediate release tablet Take 1 tablet (5 mg total) by mouth every 6 (six) hours as  needed for moderate pain (pain score 4-6). 01/05/24   Vicci Burnard SAUNDERS, PA-C  polyethylene glycol (MIRALAX  / GLYCOLAX ) 17 g packet Take 17 g by mouth daily as needed for mild constipation. 12/20/23   Maczis, Michael M, PA-C  senna (SENOKOT) 8.6 MG tablet Take 1 tablet by mouth daily as needed for constipation.    [provider]    Allergies: Tomato    Review of Systems  HENT:  Positive for dental problem.     Updated Vital Signs BP (!) 155/97 (BP Location: Right Arm)   Pulse 100   Temp 98 F (36.7 C)   Resp 16   Ht 5' 3 (1.6 m)   Wt 86.2 kg   SpO2 96%   BMI 33.66 kg/m   Physical Exam Vitals and nursing note reviewed.  Constitutional:      Appearance: Normal appearance.  HENT:     Head: Atraumatic.     Mouth/Throat:     Comments: Patient able to open mouth fully, no trismus.  Swallowing without difficulty.  Poor dentition throughout the oral cavity diffusely.  There is edema noted to the gumline above the incisors.  Edema extends up into the philtrum and by the left nare.  No appreciable area of fluctuance or any drainage.  No overlying erythema.  Swallowing without difficult. No visualized dental or peritonsillar abscess.  Cardiovascular:     Rate and Rhythm: Normal  rate and regular rhythm.  Pulmonary:     Effort: Pulmonary effort is normal.  Neurological:     General: No focal deficit present.     Mental Status: She is alert.  Psychiatric:        Mood and Affect: Mood normal.        Behavior: Behavior normal.     (all labs ordered are listed, but only abnormal results are displayed) Labs Reviewed  BASIC METABOLIC PANEL WITH GFR - Abnormal; Notable for the following components:      Result Value   Creatinine, Ser 1.02 (*)    All other components within normal limits  CBC WITH DIFFERENTIAL/PLATELET    EKG: None  Radiology: CT Maxillofacial W Contrast Result Date: 10/13/2024 EXAM: CT Facial Bones with contrast 10/13/2024 04:03:00 PM TECHNIQUE: CT of  the facial bones was performed with the administration of 75 mL iohexol  (OMNIPAQUE ) 350 mg/mL injection. Multiplanar reformatted images are provided for review. Automated exposure control, iterative reconstruction, and/or weight based adjustment of the mA/kV was utilized to reduce the radiation dose to as low as reasonably achievable. COMPARISON: CT maxillofacial 09/15/2022 CLINICAL HISTORY: Patient feels something is in her nose, clinically appears as cellulitis. FINDINGS: AERODIGESTIVE TRACT: No mass. No edema. SALIVARY GLANDS: No acute abnormality. LYMPH NODES: No suspicious cervical lymphadenopathy. BRAIN, ORBITS AND SINUSES: No acute abnormality. BONES: Multiple dental caries and periapical lucencies. Periapical lucency involving the left maxillary central incisor is new, and there is erosion of the buccal cortex of the maxilla with an overlying rim enhancing subperiosteal fluid collection measuring 15 x 9 x 20 mm. Surrounding soft tissue swelling extends to the upper lip and inferior aspect of the nose. No fracture. IMPRESSION: 1. Dental infection involving the left maxillary central incisor with an associated 2 cm subperiosteal abscess. Electronically signed by: Dasie Hamburg MD 10/13/2024 04:26 PM EST RP Workstation: [id number]     Fine needle aspiration  Date/Time: 10/13/2024 9:38 PM  Performed by: Veta Palma, PA-C Authorized by: Veta Palma, PA-C  Consent: Verbal consent obtained Risks and benefits: risks, benefits and alternatives were discussed Consent given by: patient Patient understanding: patient states understanding of the procedure being performed Imaging studies: imaging studies available Patient identity confirmed: verbally with patient Time out: Immediately prior to procedure a time out was called to verify the correct patient, procedure, equipment, support staff and site/side marked as required. Preparation: Patient was prepped and draped in the usual sterile  fashion. Local anesthesia used: yes Anesthesia: local infiltration  Anesthesia: Local anesthesia used: yes Local Anesthetic: lidocaine  1% without epinephrine  (1ml) Anesthetic total: 1 mL  Sedation: Patient sedated: no  Comments: Patient unable to tolerate the initial injection of lidocaine  anesthetic due to pain and declines proceeding with needle aspiration.       Medications Ordered in the ED  amoxicillin -clavulanate (AUGMENTIN ) 875-125 MG per tablet 1 tablet (has no administration in time range)  ibuprofen  (ADVIL ) tablet 600 mg (600 mg Oral Given 10/13/24 1416)  acetaminophen  (TYLENOL ) tablet 1,000 mg (1,000 mg Oral Given 10/13/24 1416)  amoxicillin -clavulanate (AUGMENTIN ) 875-125 MG per tablet 1 tablet (1 tablet Oral Given 10/13/24 1416)  iohexol  (OMNIPAQUE ) 350 MG/ML injection 75 mL (75 mLs Intravenous Contrast Given 10/13/24 1602)  lidocaine  (PF) (XYLOCAINE ) 1 % injection 5 mL (5 mLs Infiltration Given 10/13/24 2116)  ketorolac  (TORADOL ) 15 MG/ML injection 15 mg (15 mg Intravenous Given 10/13/24 2115)    Clinical Course as of 10/13/24 2153  Wed Oct 13, 2024  1856 [facility]  says that their oral surgeons only take established patients, since patient is not established will try speaking to ENT  [AF]  1947 Dr. Lauraine Gaskins with ENT recommends trying to aspirate whatever we are able to get out here. She says that patient does not need to be admitted here or at [facility]. She recommends discharge home with Augmentin  and dental follow up.  [AF]    Clinical Course User Index [AF] Veta Palma, PA-C                                 Medical Decision Making Amount and/or Complexity of Data Reviewed Labs: ordered. Radiology: ordered.  Risk OTC drugs. Prescription drug management.      Differential diagnosis includes but is not limited to dental abscess or infection, ludwig angina, osteomyelitis, peritonsillar abscess, gingivitis, dental carries, sialadenitis   ED  Course:  Upon initial evaluation, patient is well appearing in no acute distress.  Patient able to open mouth fully with no trismus. Swallowing secretions without difficulty. Uvula midline. No visualized peritonsillar abscess or dental abscess. Neck is soft and supple without overlying skin change, no edema of the submandibular space or tongue, low concern for ludwig angina.  Patient does have edema over the up into the left knee and has some edema of the gumline overlying the upper incisors.  No areas of fluctuance or drainage.  Will obtain CT maxillofacial to evaluate for any potentially deeper abscesses.  Labs Ordered: I Ordered, and personally interpreted labs.  The pertinent results include:   CBC without leukocytosis BMP unremarkable  Imaging Studies ordered: I ordered imaging studies including CT maxillofacial I independently visualized the imaging with scope of interpretation limited to determining acute life threatening conditions related to emergency care. Imaging showed  IMPRESSION:  1. Dental infection involving the left maxillary central incisor with an  associated 2 cm subperiosteal abscess.   I agree with the radiologist interpretation   Consultations Obtained: I requested consultation with the ENT Dr. Lauraine Gaskins at [facility],  and discussed lab and imaging findings as well as pertinent plan - they recommend: Dr. Gaskins also personally reviewed patient's images that were shared via [employer].  She recommends attempting  needle aspiration here in the emergency room.  She states that patient does not need any admission here or at [facility].  She recommends that patient be discharged home with course of Augmentin  and close dental follow-up.  Medications Given: Ibuprofen  Tylenol  Augmentin  x2 Toradol   Upon re-evaluation, patient remains well-appearing with stable vitals.  I discussed with patient that she has an abscess noted on her CT scan and that the  ENT recommended attempting needle aspiration here.  I had shared decision making conversation with patient regarding discharging on antibiotics vs attempting aspiration and also being discharged on antibiotics. Patient would like the needle aspiration to be performed. Needle aspiration was attempted.  I injected lidocaine  for anesthetic, and patient had significant discomfort with this and declined further needle aspiration.  I discussed that we will try course of Augmentin  at home for her abscess per ENT recommendations.  She states that she is homeless and has difficulty with obtaining medications due to cost.  This was discussed with social work who stated that since patient is on Medicaid, her Augmentin  prescription should be covered at no cost to the patient.  I discussed this with the patient and she states that she should be able  to pick up her prescriptions from [facility].  Patient was given her initial dose of Augmentin  today at 2 PM and her at 10 PM. Patient without any fever, tachycardia, leukocytosis, or other signs of systemic infection/sepsis.   Patient stable and appropriate for discharge home with close dental follow-up    Impression: Subperiosteal abscess of left maxillary incisor   Disposition:  The patient was discharged home with instructions to take tylenol  and ibuprofen  as needed for pain. Follow up with their dentist, or dentist provided in discharge paperwork, within the next 2 days for recheck of symptoms and further care. Take 7 day course of Augmentin  as prescribed. Return precautions given and patient verbalized understanding.     This chart was dictated using voice recognition software, Dragon. Despite the best efforts of this provider to proofread and correct errors, errors may still occur which can change documentation meaning.      Final diagnoses:  Dental abscess    ED Discharge Orders          Ordered    amoxicillin -clavulanate (AUGMENTIN ) 875-125 MG  tablet  2 times daily        10/13/24 2150               Veta Palma, PA-C 10/13/24 2153    Tegeler, Lonni PARAS, MD 10/13/24 2356  "

## 2024-10-13 NOTE — Discharge Instructions (Addendum)
 You have been seen today for your dental pain  Medications: You have been prescribed 800mg  of ibuprofen . You may take this medication every 8 hours as needed. You were given a dose here today, next dose can be at 4am.  You may take up to 1000mg  of tylenol  every 6 hours as needed for pain. Do not take more then 4g per day.   You have been prescribed an antibiotic called Augmentin  (amoxicillin -clavulanate). Take this antibiotic 2 times a day for the next 7 days. Take the full course of your antibiotic even if you start feeling better. Antibiotics may cause you to have diarrhea.  Follow up: Please make an appointment with your dentist within the next 2 days for repeat evaluation and further management.   If you do not have a dentist, you can establish care with one of the offices below: [facility] -  [address] [place] - [place] [address] [phone]  [facility]) treats patients who are in the following groups:  Pediatrics with medicaid up to age 25 Patients over 31 with an orange card and a referral from a medical provider.  - If they are referred - there is a 40$ copay for any service - extration etc.  To get an orange card the following process is necessary.  Call the [facility] to apply for card - [phone] Once approved, you the patient will be set up with a medical provider Medical provider will refer patient to the [facility] clinic .  Offices accepting Medicaid patients:  [facility] (will see emergency dental patients from ED) - [address] [place], [place] [address] [phone]  [facility] - [address] [place], [place] [address] [phone]  [facility] - [address], [place] [address] [phone]    Return to the ER if you have difficulty breathing, difficulty swallowing, severe mouth or jaw pain, you cannot open your mouth, you develop fever or chills, you have any other  new or concerning symptoms

## 2024-10-13 NOTE — Progress Notes (Signed)
 Nicole Blevins is a 54 yo f who presented to the ED for evaluation of facial swelling/tooth pain. SW asked to see patient as she was reportedly unhoused.   CSW met with patient at bedside. She explained that she is staying at a motel ([employer]) with a friend. She was requesting resources related to finding a more stable housing situation. CSW provided and reviewed in detail local [place] housing resources. Encouraged patient strongly to follow up with provided resources, especially coordinated entry. Supportive listening utilized, empathy provided. Patient also provided with food/drink.   Discussed with nursing that she will need a cab ride back to the motel she is staying at.
